# Patient Record
Sex: Female | Born: 1965 | Race: White | Hispanic: No | Marital: Married | Smoking: Former smoker
Health system: Southern US, Community
[De-identification: ages and names within clinical notes are randomized; demographics above are authoritative.]

## PROBLEM LIST (undated history)

## (undated) DIAGNOSIS — N319 Neuromuscular dysfunction of bladder, unspecified: Secondary | ICD-10-CM

## (undated) DIAGNOSIS — I10 Essential (primary) hypertension: Secondary | ICD-10-CM

## (undated) DIAGNOSIS — I471 Supraventricular tachycardia, unspecified: Secondary | ICD-10-CM

## (undated) DIAGNOSIS — G35 Multiple sclerosis: Secondary | ICD-10-CM

## (undated) DIAGNOSIS — F419 Anxiety disorder, unspecified: Secondary | ICD-10-CM

## (undated) HISTORY — DX: Neuromuscular dysfunction of bladder, unspecified: N31.9

## (undated) HISTORY — DX: Anxiety disorder, unspecified: F41.9

## (undated) HISTORY — DX: Multiple sclerosis: G35

## (undated) HISTORY — DX: Essential (primary) hypertension: I10

---

## 1999-05-11 DIAGNOSIS — G35 Multiple sclerosis: Secondary | ICD-10-CM

## 1999-05-11 HISTORY — DX: Multiple sclerosis: G35

## 2004-02-13 ENCOUNTER — Ambulatory Visit: Payer: Self-pay

## 2005-03-15 ENCOUNTER — Ambulatory Visit: Payer: Self-pay

## 2006-04-28 ENCOUNTER — Ambulatory Visit: Payer: Self-pay

## 2008-05-27 ENCOUNTER — Inpatient Hospital Stay: Payer: Self-pay | Admitting: Internal Medicine

## 2008-07-19 ENCOUNTER — Ambulatory Visit: Payer: Self-pay | Admitting: Internal Medicine

## 2010-11-16 LAB — HM PAP SMEAR: HM Pap smear: NORMAL

## 2011-02-11 ENCOUNTER — Ambulatory Visit: Payer: Self-pay | Admitting: Internal Medicine

## 2011-09-23 ENCOUNTER — Ambulatory Visit (INDEPENDENT_AMBULATORY_CARE_PROVIDER_SITE_OTHER): Payer: BC Managed Care – PPO | Admitting: Internal Medicine

## 2011-09-23 ENCOUNTER — Encounter: Payer: Self-pay | Admitting: Internal Medicine

## 2011-09-23 VITALS — BP 152/80 | HR 68 | Temp 98.1°F | Resp 16 | Ht 66.0 in | Wt 234.5 lb

## 2011-09-23 DIAGNOSIS — I10 Essential (primary) hypertension: Secondary | ICD-10-CM | POA: Insufficient documentation

## 2011-09-23 DIAGNOSIS — G35A Relapsing-remitting multiple sclerosis: Secondary | ICD-10-CM | POA: Insufficient documentation

## 2011-09-23 DIAGNOSIS — E669 Obesity, unspecified: Secondary | ICD-10-CM

## 2011-09-23 DIAGNOSIS — G35 Multiple sclerosis: Secondary | ICD-10-CM

## 2011-09-23 DIAGNOSIS — R030 Elevated blood-pressure reading, without diagnosis of hypertension: Secondary | ICD-10-CM

## 2011-09-23 NOTE — Patient Instructions (Signed)
Consider the Low Glycemic Index Diet and 6 smaller meals daily .  This boosts your metabolism and regulates your sugars:   7 AM Low carbohydrate Protein  Shakes (EAS Carb Control  Or Atkins ,  Available everywhere,   In  cases at BJs )  2.5 carbs  (Add or substitute a toasted sandwhich thin w/ peanut butter)  10 AM: Protein bar by Atkins (snack size,  Chocolate lover's variety at  BJ's)    Lunch: sandwich on pita bread or flatbread (Joseph's makes a pita bread and a flat bread , available at [employer] and BJ's; Toufayah makes a low carb flatbread available at [employer] and [employer]) Mission makes a low carb whole wheat tortilla available at BJs,and most grocery stores   3 PM:  Mid day :  Another protein bar,  Or a  cheese stick, 1/4 cup of almonds, walnuts, pistachios, pecans, peanuts,  Macadamia nuts  6 PM  Dinner:  "mean and green:"  Meat/chicken/fish, salad, and green veggie : use ranch, vinagrette,  Blue cheese, etc  9 PM snack : Breyer's low carb fudgsicle or  ice cream bar (Carb Smart), or  Weight Watcher's ice cream bar , or another protein shake  

## 2011-09-23 NOTE — Progress Notes (Signed)
Patient ID: Natalie Pugh, female   DOB: Oct 17, 1965, 46 y.o.   MRN: [id number]  A new patient to establish primary care .  She has a history of supraventricular tachycardia that was diagnosed by Dr. Kirke Corin in 2010 and therefore decided to  transfer to [facility] primary care as well. She has a history of multiple sclerosis and urinary incontinence secondary to this disorder. She sees Dr. Kelly Splinter in 2 neurology and and Dr. Achilles Dunk in from his urology in [place]. Her prior PCP was Hilda Lias. Cornelia Copa at [facility] in [place].  She is up-to-date on cervical cancer screening. Her last Pap smear was 12/17/2010. Her last mammogram was October 2012.   Her chief complaint is overweight. She is interested in losing weight by diet and exercise. She has gained 100 pounds since the birth of her children and would like to adopt a healthier lifestyle with steady weight loss until she can reach BMI of 25.  She has tried Weight Watchers in the past with limited success

## 2011-09-23 NOTE — Assessment & Plan Note (Addendum)
Relapsing remitting managed by doctors on [facility] go for neurologic. For her concurren optic neuritis, she is scheduled to follow up with [facility], but is overdue by one year because of financial difficulties.

## 2011-09-26 ENCOUNTER — Encounter: Payer: Self-pay | Admitting: Internal Medicine

## 2011-09-26 NOTE — Assessment & Plan Note (Signed)
She has a history of white coat syndrome and takes metoprolol for management of her SVT.

## 2011-09-26 NOTE — Assessment & Plan Note (Addendum)
Currently her obesity has been a lifelong struggle. She did lose 110 pounds as a teenager but gained her weight back after she had children. have addressed  BMI and recommended a low glycemic index diet utilizing smaller more frequent meals to increase metabolism.  I have also recommended that patient start exercising with a goal of 30 minutes of aerobic exercise a minimum of 5 days per week. Screening for lipid disorders, thyroid and diabetes was done last August at her last annual exam. These will be repeated prior to her physical in August which is at times during a diet we have discussed. A total of 45 minutes was spent with this patient today in evaluation and management.

## 2011-12-07 ENCOUNTER — Telehealth: Payer: Self-pay | Admitting: *Deleted

## 2011-12-07 ENCOUNTER — Other Ambulatory Visit (INDEPENDENT_AMBULATORY_CARE_PROVIDER_SITE_OTHER): Payer: BC Managed Care – PPO | Admitting: *Deleted

## 2011-12-07 DIAGNOSIS — E669 Obesity, unspecified: Secondary | ICD-10-CM

## 2011-12-07 DIAGNOSIS — Z0000 Encounter for general adult medical examination without abnormal findings: Secondary | ICD-10-CM

## 2011-12-07 DIAGNOSIS — Z79899 Other long term (current) drug therapy: Secondary | ICD-10-CM

## 2011-12-07 LAB — COMPREHENSIVE METABOLIC PANEL
ALT: 62 U/L — ABNORMAL HIGH (ref 0–35)
AST: 44 U/L — ABNORMAL HIGH (ref 0–37)
Alkaline Phosphatase: 46 U/L (ref 39–117)
Sodium: 138 mEq/L (ref 135–145)
Total Bilirubin: 0.4 mg/dL (ref 0.3–1.2)
Total Protein: 7.1 g/dL (ref 6.0–8.3)

## 2011-12-07 LAB — HEMOGLOBIN A1C: Hgb A1c MFr Bld: 6 % (ref 4.6–6.5)

## 2011-12-07 NOTE — Telephone Encounter (Signed)
Orders in epic,  thanks

## 2011-12-07 NOTE — Telephone Encounter (Signed)
Patient came in for lab this morning, what would you like me to order?

## 2011-12-17 ENCOUNTER — Ambulatory Visit (INDEPENDENT_AMBULATORY_CARE_PROVIDER_SITE_OTHER): Payer: BC Managed Care – PPO | Admitting: Internal Medicine

## 2011-12-17 ENCOUNTER — Encounter: Payer: Self-pay | Admitting: Internal Medicine

## 2011-12-17 VITALS — BP 128/94 | HR 84 | Temp 98.4°F | Resp 12 | Ht 65.0 in | Wt 225.5 lb

## 2011-12-17 DIAGNOSIS — G4700 Insomnia, unspecified: Secondary | ICD-10-CM

## 2011-12-17 DIAGNOSIS — E669 Obesity, unspecified: Secondary | ICD-10-CM

## 2011-12-17 DIAGNOSIS — Z1239 Encounter for other screening for malignant neoplasm of breast: Secondary | ICD-10-CM

## 2011-12-17 DIAGNOSIS — R7989 Other specified abnormal findings of blood chemistry: Secondary | ICD-10-CM

## 2011-12-17 DIAGNOSIS — Z124 Encounter for screening for malignant neoplasm of cervix: Secondary | ICD-10-CM

## 2011-12-17 DIAGNOSIS — I10 Essential (primary) hypertension: Secondary | ICD-10-CM

## 2011-12-17 DIAGNOSIS — Z0000 Encounter for general adult medical examination without abnormal findings: Secondary | ICD-10-CM

## 2011-12-17 DIAGNOSIS — R030 Elevated blood-pressure reading, without diagnosis of hypertension: Secondary | ICD-10-CM

## 2011-12-17 MED ORDER — AMLODIPINE BESYLATE 2.5 MG PO TABS: 2.5000 mg | ORAL_TABLET | Freq: Every day | ORAL | Status: DC

## 2011-12-17 NOTE — Patient Instructions (Addendum)
Your liver enzymes were sightly elevated.  We will repeat them  At the end of August with fasting lipids     Consider a Low Glycemic Index Diet and eating 6 smaller meals daily .  This frequent feeding stimulates your metabolism and the lower glycemic index foods will lower your blood sugars:   This is an example of my daily  "Low GI"  Diet:  All of the foods can be found at grocery stores and in bulk at BJs  club   7 AM Breakfast:  Low carbohydrate Protein  Shakes (I recommend the EAS AdvantEdge "Carb Control" shakes  Or the low carb shakes by Atkins.   Both are available everywhere:  In  cases at BJs  Or in 4 packs at grocery stores and pharmacies  2.5 carbs  (Alternative is  a toasted Arnold's Sandwhich Thin w/ peanut butter, a "Bagel Thin" with cream cheese and salmon) or  a scrambled egg burrito made with a low carb tortilla .  Avoid cereal and bananas, oatmeal too unless the old fashioned kind that takes 30-40 minutes to prepare.  the rest is overly processed, has minimal fiber, and loaded with carbohydrates!   10 AM: Protein bar by Atkins (the snack size, under 200 cal.  There are many varieties , available widely again or in bulk in limited varieties at BJs)  Other so called "protein bars" tend to be loaded with carbohydrates.  Remember, in food advertising, the word "energy" is synonymous for " carbohydrate."  Lunch: sandwich of [place], (or any lunchmeat or canned tuna), fresh avocado and cheese on a lower carbohydrate pita bread, flatbread, or tortilla . Ok to use mayonnaise. The bread is the only source or carbohydrate that can be decreased (Joseph's makes a pita bread and a flat bread  Are 50 cal and 4 net carbs ; Toufayan makes a low carb flatbread 100 cal and 9 net carbs  and  Mission makes a low carb whole wheat tortilla  210 cal and 6 net carbs)  3 PM:  Mid day :  Another protein bar,  Or a  cheese stick (100 cal, 0 carbs),  Or 1 ounce of  almonds, walnuts, pistachios, pecans, peanuts,   Macadamia nuts. Or a Dannon light n Fit greek yogurt, 80 cal 8 net carbs . Avoid "granola"; the dried cranberries and raisins are loaded with carbohydrates.    6 PM  Dinner:  "mean and green:"  Meat/chicken/fish or a high protein legume; , with a green salad, and a low GI  Veggie (broccoli, cauliflower, green beans, spinach, brussel sprouts. Lima beans) : Avoid "Low fat dressings, Reyne Dumas and [address]! They are loaded with sugar! Instead use ranch, vinagrette,  Blue cheese, etc  9 PM snack : Breyer's "low carb" fudgsicle or  ice cream bar (Carb Smart line), or  Weight Watcher's ice cream bar , or anouther "no sugar added" ice cream; or another protein shake or a serving of fresh fruit with whipped cream (Avoid bananas, pineapple, grapes  and watermelon on a regular basis because they are high in sugar)   Remember that snack Substitutions should be less than 15 to 20 carbs  Per serving. Remember to subtract fiber grams to get the "net carbs."

## 2011-12-17 NOTE — Progress Notes (Signed)
Patient ID: Natalie Pugh, female   DOB: 15-Jun-1965, 46 y.o.   MRN: [id number]  Subjective:     Natalie Pugh is a 46 y.o. female and is here for a comprehensive physical exam. The patient reports no problems.  History   Social History  . Marital Status: Married    Spouse Name: N/A    Number of Children: N/A  . Years of Education: N/A   Occupational History  . Not on file.   Social History Main Topics  . Smoking status: Former Smoker    Types: Cigarettes    Quit date: 09/22/1993  . Smokeless tobacco: Never Used  . Alcohol Use: Yes  . Drug Use: No  . Sexually Active: Not on file   Other Topics Concern  . Not on file   Social History Narrative  . No narrative on file   Health Maintenance  Topic Date Due  . Tetanus/tdap  01/18/1983  . Influenza Vaccine  02/08/2012  . Pap Smear  11/15/2013    The following portions of the patient's history were reviewed and updated as appropriate: allergies, current medications, past family history, past medical history, past social history, past surgical history and problem list.  Review of Systems A comprehensive review of systems was negative.   Objective:    BP 128/94  Pulse 84  Temp 98.4 F (36.9 C) (Oral)  Resp 12  Ht 5\' 5"  (1.651 m)  Wt 225 lb 8 oz (102.286 kg)  BMI 37.53 kg/m2  SpO2 97%  LMP 12/14/2011 General appearance: alert, cooperative and appears stated age Eyes: conjunctivae/corneas clear. PERRL, EOM's intact. Fundi benign. Ears: normal TM's and external ear canals both ears Neck: no adenopathy, no carotid bruit, no JVD, supple, symmetrical, trachea midline and thyroid not enlarged, symmetric, no tenderness/mass/nodules Lungs: clear to auscultation bilaterally Breasts: normal appearance, no masses or tenderness Heart: regular rate and rhythm, S1, S2 normal, no murmur, click, rub or gallop Abdomen: soft, non-tender; bowel sounds normal; no masses,  no organomegaly Extremities: extremities normal, atraumatic, no  cyanosis or edema Pulses: 2+ and symmetric Skin: Skin color, texture, turgor normal. No rashes or lesions    Assessment:   Obesity (BMI 30-39.9) I have addressed  BMI and given her hemoglobin A1c is 6.0 I have recommended a low glycemic index diet utilizing smaller more frequent meals to increase metabolism.  I have also recommended that patient start exercising with a goal of 30 minutes of aerobic exercise a minimum of 5 days per week. She has lost 10 pounds since her last visit .  Thyroid function is normal. her labs suggest she may have hepatic steatosis vs hepatic inflammation from her MS medications.l  She will return for repeat hepatic panel and fasting lipids in 4 weeks.      Other abnormal blood chemistry hepatic enzymes were slightly elevated, which could be due to fatty liver given her obesity versus inflammation from her MS medications. She will return in 4 weeks for repeat. If still abnormal we will start serologies for autoimmune hepatitis and obtain an abdominal ultrasound.  White coat syndrome without diagnosis of hypertension Her diastolic blood pressures have been elevated both here and at home. Determined that given her urinary frequency the best choice would be of amlodipine will start a very low dose to 2.5 mg   Updated Medication List Outpatient Encounter Prescriptions as of 12/17/2011  Medication Sig Dispense Refill  . ALPRAZolam (XANAX) 1 MG tablet Take 0.5 mg by mouth at bedtime as needed.      Marland Kitchen  Cholecalciferol (VITAMIN D3) 1000 UNITS CAPS Take by mouth.      . interferon beta-1a (REBIF) 44 MCG/0.5ML injection Inject 44 mcg into the skin 3 (three) times a week.      . metoprolol succinate (TOPROL-XL) 50 MG 24 hr tablet Take 50 mg by mouth daily. Take with or immediately following a meal.      . Multiple Vitamin (MULTIVITAMIN) tablet Take 1 tablet by mouth daily.      Marland Kitchen oxybutynin (DITROPAN-XL) 5 MG 24 hr tablet Take 5 mg by mouth daily.      Marland Kitchen amLODipine (NORVASC)  2.5 MG tablet Take 1 tablet (2.5 mg total) by mouth daily.  90 tablet  3

## 2011-12-19 ENCOUNTER — Encounter: Payer: Self-pay | Admitting: Internal Medicine

## 2011-12-19 DIAGNOSIS — R7989 Other specified abnormal findings of blood chemistry: Secondary | ICD-10-CM | POA: Insufficient documentation

## 2011-12-19 DIAGNOSIS — R7303 Prediabetes: Secondary | ICD-10-CM | POA: Insufficient documentation

## 2011-12-19 DIAGNOSIS — F411 Generalized anxiety disorder: Secondary | ICD-10-CM | POA: Insufficient documentation

## 2011-12-19 DIAGNOSIS — I10 Essential (primary) hypertension: Secondary | ICD-10-CM | POA: Insufficient documentation

## 2011-12-19 NOTE — Assessment & Plan Note (Signed)
Her diastolic blood pressures have been elevated both here and at home. Determined that given her urinary frequency the best choice would be of amlodipine will start a very low dose to 2.5 mg

## 2011-12-19 NOTE — Assessment & Plan Note (Signed)
hepatic enzymes were slightly elevated, which could be due to fatty liver given her obesity versus inflammation from her MS medications. She will return in 4 weeks for repeat. If still abnormal we will start serologies for autoimmune hepatitis and obtain an abdominal ultrasound.

## 2011-12-19 NOTE — Assessment & Plan Note (Addendum)
I have addressed  BMI and given her hemoglobin A1c is 6.0 I have recommended a low glycemic index diet utilizing smaller more frequent meals to increase metabolism.  I have also recommended that patient start exercising with a goal of 30 minutes of aerobic exercise a minimum of 5 days per week. She has lost 10 pounds since her last visit .  Thyroid function is normal. her labs suggest she may have hepatic steatosis vs hepatic inflammation from her MS medications.l  She will return for repeat hepatic panel and fasting lipids in 4 weeks.

## 2011-12-20 ENCOUNTER — Other Ambulatory Visit: Payer: Self-pay | Admitting: Internal Medicine

## 2011-12-20 MED ORDER — METOPROLOL SUCCINATE ER 50 MG PO TB24: 50.0000 mg | ORAL_TABLET | Freq: Every day | ORAL | Status: DC

## 2012-01-04 ENCOUNTER — Other Ambulatory Visit: Payer: BC Managed Care – PPO

## 2012-01-05 ENCOUNTER — Other Ambulatory Visit (INDEPENDENT_AMBULATORY_CARE_PROVIDER_SITE_OTHER): Payer: BC Managed Care – PPO | Admitting: *Deleted

## 2012-01-05 DIAGNOSIS — R7989 Other specified abnormal findings of blood chemistry: Secondary | ICD-10-CM

## 2012-01-05 DIAGNOSIS — E669 Obesity, unspecified: Secondary | ICD-10-CM

## 2012-01-05 LAB — COMPREHENSIVE METABOLIC PANEL
ALT: 21 U/L (ref 0–35)
CO2: 25 mEq/L (ref 19–32)
Calcium: 8.8 mg/dL (ref 8.4–10.5)
Chloride: 104 mEq/L (ref 96–112)
GFR: 79.1 mL/min (ref >60.00)
Sodium: 137 mEq/L (ref 135–145)
Total Protein: 6.9 g/dL (ref 6.0–8.3)

## 2012-01-05 LAB — LDL CHOLESTEROL, DIRECT: Direct LDL: 81.8 mg/dL

## 2012-01-13 ENCOUNTER — Other Ambulatory Visit: Payer: BC Managed Care – PPO

## 2012-01-18 ENCOUNTER — Telehealth: Payer: Self-pay | Admitting: Internal Medicine

## 2012-01-18 NOTE — Telephone Encounter (Signed)
Patient notified.  Labs mailed to her home address.

## 2012-01-18 NOTE — Telephone Encounter (Signed)
All labs normal,. Including cholesterol   

## 2012-01-18 NOTE — Telephone Encounter (Signed)
Patient called requesting her lab results. Please advise.

## 2012-06-24 ENCOUNTER — Other Ambulatory Visit: Payer: Self-pay

## 2012-07-14 ENCOUNTER — Ambulatory Visit: Payer: BC Managed Care – PPO | Admitting: Internal Medicine

## 2012-07-19 ENCOUNTER — Encounter: Payer: Self-pay | Admitting: Internal Medicine

## 2012-07-19 ENCOUNTER — Ambulatory Visit (INDEPENDENT_AMBULATORY_CARE_PROVIDER_SITE_OTHER): Payer: BC Managed Care – PPO | Admitting: Internal Medicine

## 2012-07-19 VITALS — BP 128/86 | HR 83 | Temp 97.7°F | Resp 16 | Wt 220.5 lb

## 2012-07-19 DIAGNOSIS — G4700 Insomnia, unspecified: Secondary | ICD-10-CM

## 2012-07-19 DIAGNOSIS — E119 Type 2 diabetes mellitus without complications: Secondary | ICD-10-CM

## 2012-07-19 DIAGNOSIS — Z23 Encounter for immunization: Secondary | ICD-10-CM

## 2012-07-19 DIAGNOSIS — E669 Obesity, unspecified: Secondary | ICD-10-CM

## 2012-07-19 DIAGNOSIS — E1059 Type 1 diabetes mellitus with other circulatory complications: Secondary | ICD-10-CM

## 2012-07-19 DIAGNOSIS — F411 Generalized anxiety disorder: Secondary | ICD-10-CM

## 2012-07-19 DIAGNOSIS — F419 Anxiety disorder, unspecified: Secondary | ICD-10-CM

## 2012-07-19 DIAGNOSIS — Z79899 Other long term (current) drug therapy: Secondary | ICD-10-CM

## 2012-07-19 DIAGNOSIS — R7989 Other specified abnormal findings of blood chemistry: Secondary | ICD-10-CM

## 2012-07-19 LAB — COMPREHENSIVE METABOLIC PANEL
Albumin: 3.7 g/dL (ref 3.5–5.2)
BUN: 15 mg/dL (ref 6–23)
CO2: 25 mEq/L (ref 19–32)
Calcium: 8.9 mg/dL (ref 8.4–10.5)
Chloride: 106 mEq/L (ref 96–112)
GFR: 72.74 mL/min (ref >60.00)
Glucose, Bld: 105 mg/dL — ABNORMAL HIGH (ref 70–99)
Potassium: 4.4 mEq/L (ref 3.5–5.1)

## 2012-07-19 LAB — CBC WITH DIFFERENTIAL/PLATELET
Basophils Relative: 0.3 % (ref 0.0–3.0)
Eosinophils Relative: 1.2 % (ref 0.0–5.0)
Hemoglobin: 12.5 g/dL (ref 12.0–15.0)
Lymphocytes Relative: 25.9 % (ref 12.0–46.0)
Neutro Abs: 3.4 10*3/uL (ref 1.4–7.7)
Neutrophils Relative %: 62.5 % (ref 43.0–77.0)
RBC: 3.96 Mil/uL (ref 3.87–5.11)
WBC: 5.4 10*3/uL (ref 4.5–10.5)

## 2012-07-19 LAB — HM DIABETES FOOT EXAM: HM Diabetic Foot Exam: NORMAL

## 2012-07-19 LAB — MICROALBUMIN / CREATININE URINE RATIO
Creatinine,U: 33.3 mg/dL
Microalb, Ur: 0.1 mg/dL (ref 0.0–1.9)

## 2012-07-19 MED ORDER — METOPROLOL SUCCINATE ER 50 MG PO TB24: 50.0000 mg | ORAL_TABLET | Freq: Every day | ORAL | Status: DC

## 2012-07-19 MED ORDER — ALPRAZOLAM 1 MG PO TABS: 0.5000 mg | ORAL_TABLET | Freq: Three times a day (TID) | ORAL | Status: DC | PRN

## 2012-07-19 MED ORDER — SERTRALINE HCL 100 MG PO TABS: 100.0000 mg | ORAL_TABLET | Freq: Every day | ORAL | Status: DC

## 2012-07-19 NOTE — Patient Instructions (Addendum)
Start with 1/2 tablet zoloft and increase to 2 after 2 weeks if anxiety is jot under control    Alprazolam dose increased byt use only as needed since it is habit forming   Dreamfield's pasta is low carb and tastes great !  Try to walk for 30 minutes daily for exercise

## 2012-07-19 NOTE — Progress Notes (Signed)
Patient ID: Natalie Pugh, female   DOB: 31-Aug-1965, 47 y.o.   MRN: [id number]  Patient Active Problem List  Diagnosis  . Multiple sclerosis, relapsing-remitting  . White coat syndrome without diagnosis of hypertension  . Obesity (BMI 30-39.9)  . Other abnormal blood chemistry  . Type II or unspecified type diabetes mellitus without mention of complication, not stated as uncontrolled  . Hypertension  . Anxiety    Subjective:  CC:   Chief Complaint  Patient presents with  . Follow-up    6 month    HPI:   Natalie Laukaitisis a 47 y.o. female who presents  For 6 month follow up on hypertension, hyperlipidemia and obesity. She was able to achieve some wt loss last Fall down to 209 lbs but regained several lbs over the holidays.  She has resumed exercising and dieting.   Past Medical History  Diagnosis Date  . Multiple sclerosis, relapsing-remitting 2001    managed since 2001,  present since 1994,  Dr. Gardiner Ramus Neurolgoy  . Diabetes mellitus   . Hypertension   . Anxiety     History reviewed. No pertinent past surgical history.   The following portions of the patient's history were reviewed and updated as appropriate: Allergies, current medications, and problem list.   Review of Systems:   Patient denies headache, fevers, malaise, unintentional weight loss, skin rash, eye pain, sinus congestion and sinus pain, sore throat, dysphagia,  hemoptysis , cough, dyspnea, wheezing, chest pain, palpitations, orthopnea, edema, abdominal pain, nausea, melena, diarrhea, constipation, flank pain, dysuria, hematuria, urinary  Frequency, nocturia, numbness, tingling, seizures,  Focal weakness, Loss of consciousness,  Tremor, insomnia, depression, anxiety, and suicidal ideation.      History   Social History  . Marital Status: Married    Spouse Name: N/A    Number of Children: N/A  . Years of Education: N/A   Occupational History  . Not on file.   Social History Main Topics  .  Smoking status: Former Smoker    Types: Cigarettes    Quit date: 09/22/1993  . Smokeless tobacco: Never Used  . Alcohol Use: Yes  . Drug Use: No  . Sexually Active: Not on file   Other Topics Concern  . Not on file   Social History Narrative  . No narrative on file    Objective:  BP 128/86  Pulse 83  Temp(Src) 97.7 F (36.5 C) (Oral)  Resp 16  Wt 220 lb 8 oz (100.018 kg)  BMI 36.69 kg/m2  SpO2 97%  LMP 07/14/2012  General appearance: alert, cooperative and appears stated age Ears: normal TM's and external ear canals both ears Throat: lips, mucosa, and tongue normal; teeth and gums normal Neck: no adenopathy, no carotid bruit, supple, symmetrical, trachea midline and thyroid not enlarged, symmetric, no tenderness/mass/nodules Back: symmetric, no curvature. ROM normal. No CVA tenderness. Lungs: clear to auscultation bilaterally Heart: regular rate and rhythm, S1, S2 normal, no murmur, click, rub or gallop Abdomen: soft, non-tender; bowel sounds normal; no masses,  no organomegaly Pulses: 2+ and symmetric Skin: Skin color, texture, turgor normal. No rashes or lesions Lymph nodes: Cervical, supraclavicular, and axillary nodes normal. Foot exam:  Nails are well trimmed,  No callouses,  Sensation intact to microfilament  Assessment and Plan:  Anxiety Not well controlled on current regimen of zoloft and alprazolam.  Increased both today.   Type II or unspecified type diabetes mellitus without mention of complication, not stated as uncontrolled Well controlled with diet alone,  a1c is 5.7 a dn she has no proteinuria  Obesity (BMI 30-39.9) I have addressed  BMI and recommended a low glycemic index diet utilizing smaller more frequent meals to increase metabolism.  I have also recommended that patient start exercising with a goal of 30 minutes of aerobic exercise a minimum of 5 days per week.   Other abnormal blood chemistry Now resolved , etiology unclear,    Updated  Medication List Outpatient Encounter Prescriptions as of 07/19/2012  Medication Sig Dispense Refill  . ALPRAZolam (XANAX) 1 MG tablet Take 0.5 tablets (0.5 mg total) by mouth 3 (three) times daily as needed for sleep or anxiety.  90 tablet  3  . amLODipine (NORVASC) 2.5 MG tablet Take 1 tablet (2.5 mg total) by mouth daily.  90 tablet  3  . Cholecalciferol (VITAMIN D3) 1000 UNITS CAPS Take by mouth.      . interferon beta-1a (REBIF) 44 MCG/0.5ML injection Inject 44 mcg into the skin 3 (three) times a week.      . metoprolol succinate (TOPROL-XL) 50 MG 24 hr tablet Take 1 tablet (50 mg total) by mouth daily. Take with or immediately following a meal.  90 tablet  3  . Multiple Vitamin (MULTIVITAMIN) tablet Take 1 tablet by mouth daily.      Marland Kitchen oxybutynin (DITROPAN-XL) 5 MG 24 hr tablet Take 5 mg by mouth daily as needed.       . [DISCONTINUED] ALPRAZolam (XANAX) 1 MG tablet Take 0.5 mg by mouth at bedtime as needed.      . [DISCONTINUED] metoprolol succinate (TOPROL-XL) 50 MG 24 hr tablet Take 1 tablet (50 mg total) by mouth daily. Take with or immediately following a meal.  30 tablet  5  . sertraline (ZOLOFT) 100 MG tablet Take 1 tablet (100 mg total) by mouth daily.  90 tablet  3   No facility-administered encounter medications on file as of 07/19/2012.

## 2012-07-20 ENCOUNTER — Encounter: Payer: Self-pay | Admitting: Internal Medicine

## 2012-07-20 NOTE — Assessment & Plan Note (Signed)
Well controlled with diet alone,  a1c is 5.7 a dn she has no proteinuria

## 2012-07-20 NOTE — Assessment & Plan Note (Signed)
I have addressed  BMI and recommended a low glycemic index diet utilizing smaller more frequent meals to increase metabolism.  I have also recommended that patient start exercising with a goal of 30 minutes of aerobic exercise a minimum of 5 days per week.  

## 2012-07-20 NOTE — Assessment & Plan Note (Signed)
Not well controlled on current regimen of zoloft and alprazolam.  Increased both today.

## 2012-07-20 NOTE — Assessment & Plan Note (Signed)
Now resolved , etiology unclear,

## 2012-07-24 ENCOUNTER — Ambulatory Visit: Payer: BC Managed Care – PPO | Admitting: Internal Medicine

## 2012-11-07 ENCOUNTER — Ambulatory Visit: Payer: BC Managed Care – PPO | Admitting: Internal Medicine

## 2012-12-26 ENCOUNTER — Encounter: Payer: Self-pay | Admitting: Internal Medicine

## 2013-01-15 ENCOUNTER — Ambulatory Visit: Payer: Self-pay | Admitting: Nurse Practitioner

## 2013-02-21 ENCOUNTER — Encounter: Payer: Self-pay | Admitting: *Deleted

## 2013-02-21 ENCOUNTER — Encounter: Payer: Self-pay | Admitting: Internal Medicine

## 2013-02-21 ENCOUNTER — Ambulatory Visit (INDEPENDENT_AMBULATORY_CARE_PROVIDER_SITE_OTHER): Payer: BC Managed Care – PPO | Admitting: Internal Medicine

## 2013-02-21 VITALS — BP 146/88 | HR 81 | Temp 98.0°F | Resp 14 | Ht 66.0 in | Wt 232.0 lb

## 2013-02-21 DIAGNOSIS — Z1239 Encounter for other screening for malignant neoplasm of breast: Secondary | ICD-10-CM

## 2013-02-21 DIAGNOSIS — E669 Obesity, unspecified: Secondary | ICD-10-CM

## 2013-02-21 DIAGNOSIS — F411 Generalized anxiety disorder: Secondary | ICD-10-CM

## 2013-02-21 DIAGNOSIS — R030 Elevated blood-pressure reading, without diagnosis of hypertension: Secondary | ICD-10-CM

## 2013-02-21 DIAGNOSIS — Z79899 Other long term (current) drug therapy: Secondary | ICD-10-CM

## 2013-02-21 DIAGNOSIS — G4700 Insomnia, unspecified: Secondary | ICD-10-CM

## 2013-02-21 DIAGNOSIS — Z23 Encounter for immunization: Secondary | ICD-10-CM

## 2013-02-21 DIAGNOSIS — E559 Vitamin D deficiency, unspecified: Secondary | ICD-10-CM

## 2013-02-21 DIAGNOSIS — F419 Anxiety disorder, unspecified: Secondary | ICD-10-CM

## 2013-02-21 DIAGNOSIS — R7989 Other specified abnormal findings of blood chemistry: Secondary | ICD-10-CM

## 2013-02-21 DIAGNOSIS — E785 Hyperlipidemia, unspecified: Secondary | ICD-10-CM

## 2013-02-21 DIAGNOSIS — Z0000 Encounter for general adult medical examination without abnormal findings: Secondary | ICD-10-CM

## 2013-02-21 MED ORDER — ALPRAZOLAM 1 MG PO TABS: 0.5000 mg | ORAL_TABLET | Freq: Three times a day (TID) | ORAL | Status: DC | PRN

## 2013-02-21 MED ORDER — ESCITALOPRAM OXALATE 20 MG PO TABS: 20.0000 mg | ORAL_TABLET | Freq: Every day | ORAL | Status: DC

## 2013-02-21 MED ORDER — AMLODIPINE BESYLATE 2.5 MG PO TABS: 2.5000 mg | ORAL_TABLET | Freq: Every day | ORAL | Status: DC

## 2013-02-21 MED ORDER — LACTULOSE 20 GM/30ML PO SOLN: 30.0000 mL | ORAL | Status: DC | PRN

## 2013-02-21 NOTE — Patient Instructions (Signed)
We discussed changing generic zoloft to generic lexapro You do not need to cross taper.  Simply stop the zoloft and take the lexapro  Please check your bp once a week.,  Good control is < 130/80  mammogam will be scheduled ASAP

## 2013-02-21 NOTE — Progress Notes (Signed)
Patient ID: Natalie Pugh, female   DOB: July 05, 1965, 47 y.o.   MRN: [id number]   Subjective:     Natalie Pugh is a 47 y.o. female and is here for a comprehensive physical exam. The patient reports .Marland Kitchen  History   Social History  . Marital Status: Married    Spouse Name: N/A    Number of Children: N/A  . Years of Education: N/A   Occupational History  . Not on file.   Social History Main Topics  . Smoking status: Former Smoker    Types: Cigarettes    Quit date: 09/22/1993  . Smokeless tobacco: Never Used  . Alcohol Use: Yes  . Drug Use: No  . Sexual Activity: Yes   Other Topics Concern  . Not on file   Social History Narrative  . No narrative on file   Health Maintenance  Topic Date Due  . Influenza Vaccine  12/08/2013  . Pap Smear  12/17/2014  . Tetanus/tdap  07/20/2022    The following portions of the patient's history were reviewed and updated as appropriate: allergies, current medications, past family history, past medical history, past social history, past surgical history and problem list.  Review of Systems A comprehensive review of systems was negative.   Objective:   BP 146/88  Pulse 81  Temp(Src) 98 F (36.7 C) (Oral)  Resp 14  Ht 5\' 6"  (1.676 m)  Wt 232 lb (105.235 kg)  BMI 37.46 kg/m2  SpO2 98%  LMP 12/24/2012  General Appearance:    Alert, cooperative, no distress, appears stated age  Head:    Normocephalic, without obvious abnormality, atraumatic  Eyes:    PERRL, conjunctiva/corneas clear, EOM's intact, fundi    benign, both eyes  Ears:    Normal TM's and external ear canals, both ears  Nose:   Nares normal, septum midline, mucosa normal, no drainage    or sinus tenderness  Throat:   Lips, mucosa, and tongue normal; teeth and gums normal  Neck:   Supple, symmetrical, trachea midline, no adenopathy;    thyroid:  no enlargement/tenderness/nodules; no carotid   bruit or JVD  Back:     Symmetric, no curvature, ROM normal, no CVA  tenderness  Lungs:     Clear to auscultation bilaterally, respirations unlabored  Chest Wall:    No tenderness or deformity   Heart:    Regular rate and rhythm, S1 and S2 normal, no murmur, rub   or gallop  Breast Exam:    No tenderness, masses, or nipple abnormality  Abdomen:     Soft, non-tender, bowel sounds active all four quadrants,    no masses, no organomegaly     Extremities:   Extremities normal, atraumatic, no cyanosis or edema  Pulses:   2+ and symmetric all extremities  Skin:   Skin color, texture, turgor normal, no rashes or lesions  Lymph nodes:   Cervical, supraclavicular, and axillary nodes normal  Neurologic:   CNII-XII intact, normal strength, sensation and reflexes    throughout    Assessment and Plan  Routine general medical examination at a health care facility Annual comprehensive exam was done including breast exam.   All screenings have been addressed .   White coat syndrome without diagnosis of hypertension Elevated today.  Reviewed list of meds, patient is not taking OTC meds that could be causing,. It.  Have asked patient to recheck bp at home a minimum of 5 times over the next 4 weeks and call readings to  office for adjustment of medications.    Anxiety To discuss making a transition from Zoloft to Lexapro and she feels that her symptoms are no longer well-controlled. No cross taper required. We'll start Lexapro 20 mg.  Other abnormal blood chemistry Liver enzymes remain normal in repeat testing. No further workup required.  Obesity (BMI 30-39.9) I have addressed  BMI and recommended a low glycemic index diet utilizing smaller more frequent meals to increase metabolism.  I have also recommended that patient start exercising with a goal of 30 minutes of aerobic exercise a minimum of 5 days per week. Screening for lipid disorders, thyroid and diabetes was negative today.   Updated Medication List Outpatient Encounter Prescriptions as of 02/21/2013   Medication Sig Dispense Refill  . ALPRAZolam (XANAX) 1 MG tablet Take 0.5 tablets (0.5 mg total) by mouth 3 (three) times daily as needed for sleep or anxiety.  90 tablet  3  . amLODipine (NORVASC) 2.5 MG tablet Take 1 tablet (2.5 mg total) by mouth daily.  30 tablet  1  . Cholecalciferol (VITAMIN D3) 1000 UNITS CAPS Take by mouth.      . interferon beta-1a (REBIF) 44 MCG/0.5ML injection Inject 44 mcg into the skin 3 (three) times a week.      . metoprolol succinate (TOPROL-XL) 50 MG 24 hr tablet Take 1 tablet (50 mg total) by mouth daily. Take with or immediately following a meal.  90 tablet  3  . Multiple Vitamin (MULTIVITAMIN) tablet Take 1 tablet by mouth daily.      Marland Kitchen oxybutynin (DITROPAN-XL) 5 MG 24 hr tablet Take 5 mg by mouth daily as needed.       . [DISCONTINUED] ALPRAZolam (XANAX) 1 MG tablet Take 0.5 tablets (0.5 mg total) by mouth 3 (three) times daily as needed for sleep or anxiety.  90 tablet  3  . [DISCONTINUED] amLODipine (NORVASC) 2.5 MG tablet Take 1 tablet (2.5 mg total) by mouth daily.  90 tablet  3  . [DISCONTINUED] amLODipine (NORVASC) 2.5 MG tablet Take 2.5 mg by mouth daily.      . [DISCONTINUED] sertraline (ZOLOFT) 100 MG tablet Take 1 tablet (100 mg total) by mouth daily.  90 tablet  3  . escitalopram (LEXAPRO) 20 MG tablet Take 1 tablet (20 mg total) by mouth daily.  30 tablet  3  . Lactulose 20 GM/30ML SOLN Take 30 mLs (20 g total) by mouth as needed (constipatio n).  240 mL  2  . [DISCONTINUED] escitalopram (LEXAPRO) 20 MG tablet Take 1 tablet (20 mg total) by mouth daily.  30 tablet  3   No facility-administered encounter medications on file as of 02/21/2013.

## 2013-02-22 ENCOUNTER — Encounter: Payer: Self-pay | Admitting: *Deleted

## 2013-02-22 LAB — COMPREHENSIVE METABOLIC PANEL
AST: 29 U/L (ref 0–37)
Albumin: 4.1 g/dL (ref 3.5–5.2)
BUN: 17 mg/dL (ref 6–23)
Calcium: 9.5 mg/dL (ref 8.4–10.5)
Chloride: 97 mEq/L (ref 96–112)
Glucose, Bld: 84 mg/dL (ref 70–99)
Potassium: 4 mEq/L (ref 3.5–5.1)
Sodium: 134 mEq/L — ABNORMAL LOW (ref 135–145)
Total Protein: 7.7 g/dL (ref 6.0–8.3)

## 2013-02-22 LAB — LDL CHOLESTEROL, DIRECT: Direct LDL: 105.8 mg/dL

## 2013-02-22 LAB — VITAMIN D 25 HYDROXY (VIT D DEFICIENCY, FRACTURES): Vit D, 25-Hydroxy: 38 ng/mL (ref 30–89)

## 2013-02-23 ENCOUNTER — Encounter: Payer: Self-pay | Admitting: Internal Medicine

## 2013-02-23 MED ORDER — ESCITALOPRAM OXALATE 20 MG PO TABS: 20.0000 mg | ORAL_TABLET | Freq: Every day | ORAL | Status: DC

## 2013-02-23 NOTE — Assessment & Plan Note (Signed)
Liver enzymes remain normal in repeat testing. No further workup required.

## 2013-02-23 NOTE — Assessment & Plan Note (Signed)
Annual comprehensive exam was done including breast exam .  All screenings have been addressed .  

## 2013-02-23 NOTE — Assessment & Plan Note (Signed)
Elevated today.  Reviewed list of meds, patient is not taking OTC meds that could be causing,. It.  Have asked patient to recheck bp at home a minimum of 5 times over the next 4 weeks and call readings to office for adjustment of medications.   

## 2013-02-23 NOTE — Assessment & Plan Note (Signed)
I have addressed  BMI and recommended a low glycemic index diet utilizing smaller more frequent meals to increase metabolism.  I have also recommended that patient start exercising with a goal of 30 minutes of aerobic exercise a minimum of 5 days per week. Screening for lipid disorders, thyroid and diabetes was negative today.

## 2013-02-23 NOTE — Assessment & Plan Note (Signed)
To discuss making a transition from Zoloft to Lexapro and she feels that her symptoms are no longer well-controlled. No cross taper required. We'll start Lexapro 20 mg.

## 2013-03-12 ENCOUNTER — Encounter: Payer: Self-pay | Admitting: Internal Medicine

## 2013-04-09 ENCOUNTER — Other Ambulatory Visit: Payer: Self-pay | Admitting: Neurology

## 2013-07-04 ENCOUNTER — Other Ambulatory Visit: Payer: Self-pay | Admitting: Neurology

## 2013-07-16 ENCOUNTER — Other Ambulatory Visit: Payer: Self-pay | Admitting: *Deleted

## 2013-07-16 DIAGNOSIS — F419 Anxiety disorder, unspecified: Secondary | ICD-10-CM

## 2013-07-16 MED ORDER — ESCITALOPRAM OXALATE 20 MG PO TABS: 20.0000 mg | ORAL_TABLET | Freq: Every day | ORAL | Status: DC

## 2013-07-27 LAB — HM DIABETES EYE EXAM

## 2013-10-10 ENCOUNTER — Ambulatory Visit: Payer: Self-pay | Admitting: Neurology

## 2013-10-16 ENCOUNTER — Ambulatory Visit: Payer: BC Managed Care – PPO | Admitting: Internal Medicine

## 2013-10-25 ENCOUNTER — Encounter: Payer: Self-pay | Admitting: Internal Medicine

## 2013-10-25 ENCOUNTER — Ambulatory Visit (INDEPENDENT_AMBULATORY_CARE_PROVIDER_SITE_OTHER): Payer: BC Managed Care – PPO | Admitting: Internal Medicine

## 2013-10-25 VITALS — BP 120/84 | HR 85 | Temp 97.8°F | Resp 16 | Ht 66.5 in | Wt 216.2 lb

## 2013-10-25 DIAGNOSIS — F411 Generalized anxiety disorder: Secondary | ICD-10-CM

## 2013-10-25 DIAGNOSIS — I10 Essential (primary) hypertension: Secondary | ICD-10-CM

## 2013-10-25 DIAGNOSIS — E119 Type 2 diabetes mellitus without complications: Secondary | ICD-10-CM

## 2013-10-25 DIAGNOSIS — R300 Dysuria: Secondary | ICD-10-CM

## 2013-10-25 DIAGNOSIS — G4700 Insomnia, unspecified: Secondary | ICD-10-CM

## 2013-10-25 DIAGNOSIS — F419 Anxiety disorder, unspecified: Secondary | ICD-10-CM

## 2013-10-25 DIAGNOSIS — E669 Obesity, unspecified: Secondary | ICD-10-CM

## 2013-10-25 DIAGNOSIS — N319 Neuromuscular dysfunction of bladder, unspecified: Secondary | ICD-10-CM

## 2013-10-25 LAB — URINALYSIS, ROUTINE W REFLEX MICROSCOPIC
Bilirubin Urine: NEGATIVE
Hgb urine dipstick: NEGATIVE
Ketones, ur: NEGATIVE
NITRITE: NEGATIVE
RBC / HPF: NONE SEEN (ref 0–?)
Specific Gravity, Urine: <=1.005 — AB (ref 1.000–1.030)
Total Protein, Urine: NEGATIVE
UROBILINOGEN UA: 0.2 (ref 0.0–1.0)
Urine Glucose: NEGATIVE
pH: 7 (ref 5.0–8.0)

## 2013-10-25 LAB — LIPID PANEL
CHOL/HDL RATIO: 3
Cholesterol: 157 mg/dL (ref 0–200)
HDL: 53 mg/dL (ref >39.00)
LDL CALC: 82 mg/dL (ref 0–99)
NonHDL: 104
Triglycerides: 111 mg/dL (ref 0.0–149.0)
VLDL: 22.2 mg/dL (ref 0.0–40.0)

## 2013-10-25 LAB — COMPREHENSIVE METABOLIC PANEL
ALBUMIN: 4.1 g/dL (ref 3.5–5.2)
ALT: 17 U/L (ref 0–35)
AST: 21 U/L (ref 0–37)
Alkaline Phosphatase: 48 U/L (ref 39–117)
BUN: 14 mg/dL (ref 6–23)
CHLORIDE: 101 meq/L (ref 96–112)
CO2: 25 meq/L (ref 19–32)
Calcium: 9.2 mg/dL (ref 8.4–10.5)
Creatinine, Ser: 0.9 mg/dL (ref 0.4–1.2)
GFR: 73.32 mL/min (ref >60.00)
GLUCOSE: 86 mg/dL (ref 70–99)
POTASSIUM: 4.4 meq/L (ref 3.5–5.1)
SODIUM: 135 meq/L (ref 135–145)
TOTAL PROTEIN: 7.5 g/dL (ref 6.0–8.3)
Total Bilirubin: 0.4 mg/dL (ref 0.2–1.2)

## 2013-10-25 LAB — MICROALBUMIN / CREATININE URINE RATIO
CREATININE, U: 54.4 mg/dL
Microalb Creat Ratio: 0.6 mg/g (ref 0.0–30.0)
Microalb, Ur: 0.3 mg/dL (ref 0.0–1.9)

## 2013-10-25 LAB — POCT URINALYSIS DIPSTICK
Bilirubin, UA: NEGATIVE
GLUCOSE UA: NEGATIVE
Ketones, UA: NEGATIVE
NITRITE UA: NEGATIVE
Protein, UA: NEGATIVE
RBC UA: NEGATIVE
SPEC GRAV UA: 1.01
Urobilinogen, UA: 0.2
pH, UA: 7

## 2013-10-25 LAB — HEMOGLOBIN A1C: HEMOGLOBIN A1C: 5.9 % (ref 4.6–6.5)

## 2013-10-25 LAB — HM DIABETES FOOT EXAM: HM Diabetic Foot Exam: NORMAL

## 2013-10-25 MED ORDER — ALPRAZOLAM 1 MG PO TABS: 1.0000 mg | ORAL_TABLET | Freq: Two times a day (BID) | ORAL | Status: DC | PRN

## 2013-10-25 NOTE — Progress Notes (Signed)
Patient ID: Natalie Dutyrika Leggitt, female   DOB: [birth year], 48 y.o.   MRN: [id number]   Patient Active Problem List   Diagnosis Date Noted  . Colonization status 10/27/2013  . Routine general medical examination at a health care facility 02/21/2013  . Other abnormal blood chemistry 12/19/2011  . Type II or unspecified type diabetes mellitus without mention of complication, not stated as uncontrolled   . Hypertension   . Anxiety   . White coat syndrome without diagnosis of hypertension 09/23/2011  . Obesity (BMI 30-39.9) 09/23/2011  . Multiple sclerosis, relapsing-remitting     Subjective:  CC:   Chief Complaint  Patient presents with  . Follow-up    medication refills  . Dysuria    Patient self cath's due to MS.     HPI:   Natalie Pugh is a 48 y.o. female who presents for follow up on chronic conditions including MS, GAD, obesity, hypertension and type 2 DM , diet controlled.    She is requesting a urinalysis because she has noticed a change in odor of urine.  She has been self catheterizing has noticed more fishy odor lately.  Not taking macrobid for suppression ,  Has not had a UTI in  A long time   Obesity .  She weighed 244 lbs in January 2014,  Has lost 28 lbs since then  By exercising regularly at a gym and weight watchers . Next goal is 199.  Using a fit bit to help her stay on track. Feels much better .   Past Medical History  Diagnosis Date  . Multiple sclerosis, relapsing-remitting 2001    managed since 2001,  present since 1994,  Dr. Gardiner [facility]  . Diabetes mellitus   . Hypertension   . Anxiety     No past surgical history on file.     The following portions of the patient's history were reviewed and updated as appropriate: Allergies, current medications, and problem list.    Review of Systems:   Patient denies headache, fevers, malaise, unintentional weight loss, skin rash, eye pain, sinus congestion and sinus pain, sore throat, dysphagia,   hemoptysis , cough, dyspnea, wheezing, chest pain, palpitations, orthopnea, edema, abdominal pain, nausea, melena, diarrhea, constipation, flank pain, dysuria, hematuria, urinary  Frequency, nocturia, numbness, tingling, seizures,  Focal weakness, Loss of consciousness,  Tremor, insomnia, depression, anxiety, and suicidal ideation.     History   Social History  . Marital Status: Married    Spouse Name: N/A    Number of Children: N/A  . Years of Education: N/A   Occupational History  . Not on file.   Social History Main Topics  . Smoking status: Former Smoker    Types: Cigarettes    Quit date: 09/22/1993  . Smokeless tobacco: Never Used  . Alcohol Use: Yes  . Drug Use: No  . Sexual Activity: Yes   Other Topics Concern  . Not on file   Social History Narrative  . No narrative on file    Objective:  Filed Vitals:   10/25/13 0911  BP: 120/84  Pulse: 85  Temp: 97.8 F (36.6 C)  Resp: 16     General appearance: alert, cooperative and appears stated age Ears: normal TM's and external ear canals both ears Throat: lips, mucosa, and tongue normal; teeth and gums normal Neck: no adenopathy, no carotid bruit, supple, symmetrical, trachea midline and thyroid not enlarged, symmetric, no tenderness/mass/nodules Back: symmetric, no curvature. ROM normal. No CVA tenderness. Lungs: clear  to auscultation bilaterally Heart: regular rate and rhythm, S1, S2 normal, no murmur, click, rub or gallop Abdomen: soft, non-tender; bowel sounds normal; no masses,  no organomegaly Pulses: 2+ and symmetric Skin: Skin color, texture, turgor normal. No rashes or lesions Lymph nodes: Cervical, supraclavicular, and axillary nodes normal. Neuro: CNs 2-12 intact. DTRs 2+/4 in biceps, brachioradialis, patellars and achilles. Muscle strength 5/5 in upper and lower exremities. Fine resting tremor bilaterally both hands cerebellar function normal. Romberg negative.  No pronator drift.   Gait normal.    Assessment and Plan:  Anxiety improved with lexapro and prn alprazolam   Obesity (BMI 30-39.9) I have congratulated her in reduction of   BMI and encouraged  Continued weight loss with goal of 10% of body weigh over the next 6 months using a low glycemic index diet and regular exercise a minimum of 5 days per week.    Hypertension Well controlled on current regimen. Renal function stable, no changes today.  Lab Results  Component Value Date   CREATININE 0.9 10/25/2013   Lab Results  Component Value Date   NA 135 10/25/2013   K 4.4 10/25/2013   CL 101 10/25/2013   CO2 25 10/25/2013      Type II or unspecified type diabetes mellitus without mention of complication, not stated as uncontrolled Well-controlled on diet alone .  hemoglobin A1c has been consistently less than 7.0 . Patient is up-to-date on eye exams and foot exam was done today.  There is  no proteinuria on today's micro urinalysis .  Fasting lipids have been reviewed and statin therapy has been advised and updated according to new ACC guidelines based on patient's 10 year risk of CAD   Lab Results  Component Value Date   HGBA1C 5.9 10/25/2013   Lab Results  Component Value Date   MICROALBUR 0.3 10/25/2013      Updated Medication List Outpatient Encounter Prescriptions as of 10/25/2013  Medication Sig  . ALPRAZolam (XANAX) 1 MG tablet Take 1 tablet (1 mg total) by mouth 2 (two) times daily as needed for anxiety or sleep.  . Cholecalciferol (VITAMIN D3) 1000 UNITS CAPS Take by mouth.  . escitalopram (LEXAPRO) 20 MG tablet Take 1 tablet (20 mg total) by mouth daily.  . metoprolol succinate (TOPROL-XL) 50 MG 24 hr tablet Take 1 tablet (50 mg total) by mouth daily. Take with or immediately following a meal.  . Multiple Vitamin (MULTIVITAMIN) tablet Take 1 tablet by mouth daily.  Marland Kitchen oxybutynin (DITROPAN-XL) 5 MG 24 hr tablet Take 5 mg by mouth daily as needed.   . REBIF 44 MCG/0.5ML injection INJECT 1 PREFILLED SYRINGE  SUBCUTANEOUSLY 3 TIMES PER WEEK AS DIRECTED  . [DISCONTINUED] ALPRAZolam (XANAX) 1 MG tablet Take 0.5 tablets (0.5 mg total) by mouth 3 (three) times daily as needed for sleep or anxiety.  . [DISCONTINUED] amLODipine (NORVASC) 2.5 MG tablet Take 1 tablet (2.5 mg total) by mouth daily.  . Lactulose 20 GM/30ML SOLN Take 30 mLs (20 g total) by mouth as needed (constipatio n).

## 2013-10-25 NOTE — Patient Instructions (Signed)
You are due for your eye exam with Dr Oren BracketSydnor.  Get him to send me a report   Congratulations on the wt loss!!!  This is  One version of a  "Low GI"  Diet:  It will still lower your blood sugars and allow you to lose 4 to 8  lbs  per month if you follow it carefully.  Your goal with exercise is a minimum of 30 minutes of aerobic exercise 5 days per week (Walking does not count once it becomes easy!)    All of the foods can be found at grocery stores and in bulk at [employer].  The Atkins protein bars and shakes are available in more varieties at Target, WalMart and [employer].     7 AM Breakfast:  Choose from the following:  Low carbohydrate Protein  Shakes (I recommend the [employer]vantEdge "Carb Control" shakes  Or the low carb shakes by Atkins.    2.5 carbs   Arnold's "Sandwhich Thin"toasted  w/ peanut butter (no jelly: about 20 net carbs  "Bagel Thin" with cream cheese and salmon: about 20 carbs   a scrambled egg/bacon/cheese burrito made with Mission's "carb balance" whole wheat tortilla  (about 10 net carbs )   Avoid cereal and bananas, oatmeal and cream of wheat and grits. They are loaded with carbohydrates!   10 AM: high protein snack  Protein bar by Atkins (the snack size, under 200 cal, usually < 6 net carbs).    A stick of cheese:  Around 1 carb,  100 cal     Dannon Light n Fit [place] Yogurt  (80 cal, 8 carbs)  Other so called "protein bars" and Greek yogurts tend to be loaded with carbohydrates.  Remember, in food advertising, the word "energy" is synonymous for " carbohydrate."  Lunch:   A Sandwich using the bread choices listed, Can use any  Eggs,  lunchmeat, grilled meat or canned tuna), avocado, regular mayo/mustard  and cheese.  A Salad using blue cheese, ranch,  Goddess or vinagrette,  No croutons or "confetti" and no "candied nuts" but regular nuts OK.   No pretzels or chips.  Pickles and miniature sweet peppers are a good low carb alternative that provide a "crunch"  The  bread is the only source of carbohydrate in a sandwich and  can be decreased by trying some of these alternatives to traditional loaf bread  Joseph's makes a pita bread and a flat bread that are 50 cal and 4 net carbs available at [employer]Js and WalMart.  This can be toasted to use with hummous as well  Toufayan makes a low carb flatbread that's 100 cal and 9 net carbs available at [employer] and [employer]  Mission makes 2 sizes of  Low carb whole wheat tortilla  (The large one is 210 cal and 6 net carbs) Avoid "Low fat dressings, as well as Reyne Dum[place] and [address] dressings They are loaded with sugar!   3 PM/ Mid day  Snack:  Consider  1 ounce of  almonds, walnuts, pistachios, pecans, peanuts,  Macadamia nuts or a nut medley.  Avoid "granola"; the dried cranberries and raisins are loaded with carbohydrates. Mixed nuts as long as there are no raisins,  cranberries or dried fruit.    Try the prosciutto/mozzarella cheese sticks by Fiorruci  In deli /backery section   High protein      6 PM  Dinner:     Meat/fowl/fish with a green salad, and either  broccoli, cauliflower, green beans, spinach, brussel sprouts or  Lima beans. DO NOT BREAD THE PROTEIN!!      There is a low carb pasta by Dreamfield's that is acceptable and tastes great: only 5 digestible carbs/serving.( All grocery stores but BJs carry it )  Try Kai Levins Angelo's chicken piccata or chicken or eggplant parm over low carb pasta.(Lowes and BJs)   Clifton Custard Sanchez's "Carnitas" (pulled pork, no sauce,  0 carbs) or his beef pot roast to make a dinner burrito (at [employer]J's)  Pesto over low carb pasta (bj's sells a good quality pesto in the center refrigerated section of the deli   Try sauteeing  Roosvelt Harps with mushroooms  Whole wheat pasta is still full of digestible carbs and  Not as low in glycemic index as Dreamfield's.   Brown rice is still rice,  So skip the rice and noodles if you eat [place] or [place] (or at least limit to 1/2 cup)  9 PM snack :    Breyer's "low carb" fudgsicle or  ice cream bar (Carb Smart line), or  Weight Watcher's ice cream bar , or another "no sugar added" ice cream;  a serving of fresh berries/cherries with whipped cream   Cheese or DANNON'S LlGHT N FIT GREEK YOGURT  8 ounces of [place] unsweetened almond/cococunut milk    Avoid bananas, pineapple, grapes  and watermelon on a regular basis because they are high in sugar.  THINK OF THEM AS DESSERT  Remember that snack Substitutions should be less than 10 NET carbs per serving and meals < 20 carbs. Remember to subtract fiber grams to get the "net carbs."

## 2013-10-25 NOTE — Progress Notes (Signed)
Pre-visit discussion using our clinic review tool. No additional management support is needed unless otherwise documented below in the visit note.  

## 2013-10-25 NOTE — Assessment & Plan Note (Signed)
improved with lexapro and prn alprazolam

## 2013-10-27 ENCOUNTER — Encounter: Payer: Self-pay | Admitting: Internal Medicine

## 2013-10-27 DIAGNOSIS — A499 Bacterial infection, unspecified: Secondary | ICD-10-CM | POA: Insufficient documentation

## 2013-10-27 DIAGNOSIS — N390 Urinary tract infection, site not specified: Secondary | ICD-10-CM

## 2013-10-27 NOTE — Assessment & Plan Note (Signed)
Well-controlled on diet alone .  hemoglobin A1c has been consistently less than 7.0 . Patient is up-to-date on eye exams and foot exam was done today.  There is  no proteinuria on today's micro urinalysis .  Fasting lipids have been reviewed and statin therapy has been advised and updated according to new ACC guidelines based on patient's 10 year risk of CAD   Lab Results  Component Value Date   HGBA1C 5.9 10/25/2013   Lab Results  Component Value Date   MICROALBUR 0.3 10/25/2013

## 2013-10-27 NOTE — Assessment & Plan Note (Signed)
I have congratulated her in reduction of   BMI and encouraged  Continued weight loss with goal of 10% of body weigh over the next 6 months using a low glycemic index diet and regular exercise a minimum of 5 days per week.    

## 2013-10-27 NOTE — Assessment & Plan Note (Signed)
Well controlled on current regimen. Renal function stable, no changes today.  Lab Results  Component Value Date   CREATININE 0.9 10/25/2013   Lab Results  Component Value Date   NA 135 10/25/2013   K 4.4 10/25/2013   CL 101 10/25/2013   CO2 25 10/25/2013

## 2013-10-28 LAB — CULTURE, URINE COMPREHENSIVE

## 2013-11-07 ENCOUNTER — Other Ambulatory Visit: Payer: Self-pay | Admitting: Internal Medicine

## 2013-11-07 ENCOUNTER — Telehealth: Payer: Self-pay | Admitting: *Deleted

## 2013-11-07 DIAGNOSIS — A499 Bacterial infection, unspecified: Secondary | ICD-10-CM

## 2013-11-07 DIAGNOSIS — N390 Urinary tract infection, site not specified: Principal | ICD-10-CM

## 2013-11-07 MED ORDER — CIPROFLOXACIN HCL 500 MG PO TABS: 250.0000 mg | ORAL_TABLET | Freq: Two times a day (BID) | ORAL | Status: DC

## 2013-11-07 NOTE — Telephone Encounter (Signed)
Will treat with cipro  For E Coli UTI .  rx sent

## 2013-11-07 NOTE — Telephone Encounter (Signed)
Spoke with pt advised Rx sent 

## 2013-11-07 NOTE — Telephone Encounter (Signed)
Pt called states she is having continued UTI symptoms.  She is requesting an Rx.  Please advise

## 2013-11-30 ENCOUNTER — Other Ambulatory Visit: Payer: Self-pay | Admitting: Internal Medicine

## 2013-12-03 ENCOUNTER — Telehealth: Payer: Self-pay | Admitting: *Deleted

## 2013-12-03 DIAGNOSIS — F419 Anxiety disorder, unspecified: Secondary | ICD-10-CM

## 2013-12-03 MED ORDER — ESCITALOPRAM OXALATE 20 MG PO TABS: 20.0000 mg | ORAL_TABLET | Freq: Every day | ORAL | Status: DC

## 2013-12-03 NOTE — Telephone Encounter (Signed)
Fax from pharmacy requesting Escitalopram 20mg  tablet.  Last refill 3.9.15, last OV 6.18.15, next OV 12.17.15.  Please advise refill

## 2013-12-03 NOTE — Telephone Encounter (Signed)
90 day supply authorized and sent   

## 2014-03-11 ENCOUNTER — Telehealth: Payer: Self-pay | Admitting: Internal Medicine

## 2014-03-11 NOTE — Telephone Encounter (Signed)
Ms. Natalie Pugh called saying she noticed in her My Chart there's information saying she has Type 2 Diabetes. She said she also noticed this information listed elsewhere. Per [name], she's never been told she has Type 2 Diabetes. She said she has MS and knew she was always borderline for Diabetes but was never actually told she had it. She's very concerned about this and would like someone to call her back. Pt ph# [phone] Thank you.

## 2014-03-11 NOTE — Telephone Encounter (Signed)
Spoke with pt, advised of MDs message.  She verbalized understanding. 

## 2014-03-11 NOTE — Telephone Encounter (Signed)
Technically she has diet controlled DM since 2013 when her A1c was 6.0. , and if she didn't follow a low glycemic index diet she would probably have higher A1c's now.   But i have reviewed the last two years of her labs and she has never had a fasting sugar above 110 and her A1cs have been < 6.0 since then , so I am going to change the diagnosis to impaired fasting glucose.

## 2014-04-25 ENCOUNTER — Ambulatory Visit: Payer: BC Managed Care – PPO | Admitting: Internal Medicine

## 2014-05-10 HISTORY — PX: LUNG REMOVAL, PARTIAL: SHX233

## 2014-06-03 ENCOUNTER — Other Ambulatory Visit: Payer: Self-pay | Admitting: *Deleted

## 2014-06-03 DIAGNOSIS — F419 Anxiety disorder, unspecified: Secondary | ICD-10-CM

## 2014-06-03 MED ORDER — ESCITALOPRAM OXALATE 20 MG PO TABS: 20.0000 mg | ORAL_TABLET | Freq: Every day | ORAL | Status: DC

## 2014-11-21 ENCOUNTER — Other Ambulatory Visit (HOSPITAL_COMMUNITY)
Admission: RE | Admit: 2014-11-21 | Discharge: 2014-11-21 | Disposition: A | Discharge status: Home / Self Care | Payer: BLUE CROSS/BLUE SHIELD | Source: Ambulatory Visit | Attending: Internal Medicine | Admitting: Internal Medicine

## 2014-11-21 ENCOUNTER — Ambulatory Visit (INDEPENDENT_AMBULATORY_CARE_PROVIDER_SITE_OTHER): Payer: BLUE CROSS/BLUE SHIELD | Admitting: Internal Medicine

## 2014-11-21 ENCOUNTER — Encounter: Payer: Self-pay | Admitting: Internal Medicine

## 2014-11-21 VITALS — BP 138/90 | HR 86 | Temp 97.6°F | Resp 14 | Ht 65.75 in | Wt 233.8 lb

## 2014-11-21 DIAGNOSIS — Z01419 Encounter for gynecological examination (general) (routine) without abnormal findings: Secondary | ICD-10-CM | POA: Diagnosis present

## 2014-11-21 DIAGNOSIS — Z1151 Encounter for screening for human papillomavirus (HPV): Secondary | ICD-10-CM | POA: Insufficient documentation

## 2014-11-21 DIAGNOSIS — Z1239 Encounter for other screening for malignant neoplasm of breast: Secondary | ICD-10-CM

## 2014-11-21 DIAGNOSIS — E559 Vitamin D deficiency, unspecified: Secondary | ICD-10-CM | POA: Diagnosis not present

## 2014-11-21 DIAGNOSIS — Z0000 Encounter for general adult medical examination without abnormal findings: Secondary | ICD-10-CM | POA: Diagnosis not present

## 2014-11-21 DIAGNOSIS — E669 Obesity, unspecified: Secondary | ICD-10-CM

## 2014-11-21 DIAGNOSIS — Z124 Encounter for screening for malignant neoplasm of cervix: Secondary | ICD-10-CM | POA: Diagnosis not present

## 2014-11-21 DIAGNOSIS — R7301 Impaired fasting glucose: Secondary | ICD-10-CM | POA: Diagnosis not present

## 2014-11-21 DIAGNOSIS — Z1211 Encounter for screening for malignant neoplasm of colon: Secondary | ICD-10-CM

## 2014-11-21 DIAGNOSIS — Z79899 Other long term (current) drug therapy: Secondary | ICD-10-CM

## 2014-11-21 DIAGNOSIS — R030 Elevated blood-pressure reading, without diagnosis of hypertension: Secondary | ICD-10-CM

## 2014-11-21 DIAGNOSIS — Z1159 Encounter for screening for other viral diseases: Secondary | ICD-10-CM

## 2014-11-21 DIAGNOSIS — Q998 Other specified chromosome abnormalities: Secondary | ICD-10-CM | POA: Diagnosis not present

## 2014-11-21 LAB — CBC WITH DIFFERENTIAL/PLATELET
BASOS ABS: 0 10*3/uL (ref 0.0–0.1)
BASOS PCT: 0.5 % (ref 0.0–3.0)
Eosinophils Absolute: 0.1 10*3/uL (ref 0.0–0.7)
Eosinophils Relative: 1.9 % (ref 0.0–5.0)
HEMATOCRIT: 38.4 % (ref 36.0–46.0)
Hemoglobin: 12.7 g/dL (ref 12.0–15.0)
Lymphocytes Relative: 30.8 % (ref 12.0–46.0)
Lymphs Abs: 1.9 10*3/uL (ref 0.7–4.0)
MCHC: 33.2 g/dL (ref 30.0–36.0)
MCV: 92.9 fl (ref 78.0–100.0)
MONOS PCT: 7.8 % (ref 3.0–12.0)
Monocytes Absolute: 0.5 10*3/uL (ref 0.1–1.0)
NEUTROS ABS: 3.6 10*3/uL (ref 1.4–7.7)
NEUTROS PCT: 59 % (ref 43.0–77.0)
Platelets: 315 10*3/uL (ref 150.0–400.0)
RBC: 4.13 Mil/uL (ref 3.87–5.11)
RDW: 12.8 % (ref 11.5–15.5)
WBC: 6.2 10*3/uL (ref 4.0–10.5)

## 2014-11-21 LAB — LIPID PANEL
CHOLESTEROL: 174 mg/dL (ref 0–200)
HDL: 49.8 mg/dL (ref >39.00)
LDL Cholesterol: 97 mg/dL (ref 0–99)
NonHDL: 124.2
Total CHOL/HDL Ratio: 3
Triglycerides: 136 mg/dL (ref 0.0–149.0)
VLDL: 27.2 mg/dL (ref 0.0–40.0)

## 2014-11-21 LAB — COMPREHENSIVE METABOLIC PANEL
ALBUMIN: 4.1 g/dL (ref 3.5–5.2)
ALK PHOS: 60 U/L (ref 39–117)
ALT: 25 U/L (ref 0–35)
AST: 25 U/L (ref 0–37)
BUN: 14 mg/dL (ref 6–23)
CALCIUM: 9.6 mg/dL (ref 8.4–10.5)
CO2: 29 meq/L (ref 19–32)
CREATININE: 0.87 mg/dL (ref 0.40–1.20)
Chloride: 102 mEq/L (ref 96–112)
GFR: 73 mL/min (ref >60.00)
GLUCOSE: 92 mg/dL (ref 70–99)
POTASSIUM: 4.2 meq/L (ref 3.5–5.1)
Sodium: 139 mEq/L (ref 135–145)
Total Bilirubin: 0.5 mg/dL (ref 0.2–1.2)
Total Protein: 7.5 g/dL (ref 6.0–8.3)

## 2014-11-21 LAB — HEMOGLOBIN A1C: HEMOGLOBIN A1C: 5.9 % (ref 4.6–6.5)

## 2014-11-21 LAB — TSH: TSH: 1.44 u[IU]/mL (ref 0.35–4.50)

## 2014-11-21 LAB — VITAMIN D 25 HYDROXY (VIT D DEFICIENCY, FRACTURES): VITD: 24.6 ng/mL — ABNORMAL LOW (ref 30.00–100.00)

## 2014-11-21 NOTE — Patient Instructions (Signed)
I am ordering your mammogram and referring you for colonoscopy.  The  diet I discussed with you today is the 10 day Green Smoothie Cleansing /Detox Diet by Linden Dolin . available on [place] for around $10.  This is not a low carb or a weight loss diet,  It is fundamentally a "cleansing" low fat diet that eliminates sugar, gluten, caffeine, alcohol and dairy for 10 days .  What you add back after the initial ten days is entirely up to  you!  You can expect to lose 5 to 10 lbs depending on how strict you are.   I suggest drinking 2 smoothies daily and keeping one chewable meal (but keep it simple, like baked fish and salad, rice or bok choy) .  You snack primarily on fresh  fruit, egg whites and judicious quantities of nuts. You can add vegetable based protein powder (nothing with whey , since whey is dairy) in it.  [employer] has a Research officer, political party .  I suggest using /drinking 2 smoothies daily and have one sensible low fat meal ,  The snacks are primarily fruit, egg whites and nuts.   It does require some form of a nutrient extractor (Vita Mix, a electric juicer,  Or a Nutribullet Rx).  i have found that using frozen fruits is much more convenient and cost effective. You can even find plenty of organic fruit in the frozen fruit section of [employer]JS's.  Just thaw what you need for the following day the night before in the refrigerator (to avoid jamming up your machine)   The low carb diet is still a great option as well!   This is  my version of a  "Low GI"  Weight loss Diet:  It has enabled many of my patients to lose up to 10 lbs per month depending on how much you restrict your carbs.   follow it carefully.  The idea behind low carb diets is that your body has to take the fat and protein in your food and turn it into an energy that is less efficient than carbohydrates, so you lose weight.    I have listed several choices at each of your 6 meal times to make it easy to shop.and plan    Remember that snack  Substitutions should be equal to or less than 5 NET carbs per serving and  The only carbs in meals come from the pasta or vegetables so, they should be < 15 net carbs. Remember that carbohydrates from fiber do not count, so you can  subtract fiber grams to get the "net carbs " of any particular food item.    All of the foods can be found at grocery stores and in bulk at [employer].  The Atkins protein bars and shakes are available in more varieties at [employer], [employer] and [place].     7 AM Breakfast:  Choose from the following:  Low carbohydrate Protein  Shakes (I recommend the EAS AdvantEdge "Carb Control" shakes, Atkins,  Muscle Milk or Premier Protein shakes  All are < 4  carbs . My favorite is the premier protein chocolate   a scrambled egg/bacon/cheese burrito made with Mission's "carb balance" whole wheat tortilla  (this particular tortilla has only 6 net carbs, once you subtract the fiber grams  )  A slice of home made fritatta (egg based dish without a crust:  google it)    Avoid cereal and bananas, oatmeal and cream of wheat and grits.  They are loaded with carbohydrates and have too few fiber grams    10 AM: high protein snack  Protein bar by Atkins  Or KIND  (the lw sugar variety,  Not all are low , under 200 cal, usually < 6 net carbs).    A stick of cheese:  Around 1 carb,  100 cal      Other so called "protein bars" and Greek yogurts tend to be loaded with carbohydrates.  Remember, in food advertising, the word "energy" is synonymous for " carbohydrate."  Lunch:   A Sandwich using the bread choices listed below,  You  Can use any  Eggs,  lunchmeat, grilled meat or canned tuna), avocado, regular mayo/mustard  and cheese.  A Salad using blue cheese, ranch,  Goddess or vinagrette,  No croutons or "confetti" and no "candied nuts" but regular nuts OK. No "fat free": they add sugar for taste  No pretzels or chips.  Pickles and miniature sweet peppers are a good low carb alternative  that provide a "crunch"   Please Note:  The bread is the only source of carbohydrate in a sandwich and  can be decreased by trying some of these alternatives to traditional loaf bread. Kristopher Oppenheim has the best selection:  Joseph's makes a pita bread and a flat ("Lavash")  bread that are 50 cal and 4 net carbs and are available at [place] and [place].  These can be toasted to make them taste better,  And can be  used as a pita chip to use with hummous as well  Toufayan makes a low carb flatbread that's 100 cal and 9 net carbs available at [employer] and [employer] makes 2 sizes of  Low carb whole wheat tortilla  (The large one is 210 cal and 6 net carbs, the small is 120 cal and also 6 net carbs)  Flat Out makes flatbreads that are low carb as well . They're called "Fold Its")   Avoid "Low fat dressings, as well as Barry Brunner and [place] dressings They are loaded with sugar!   3 PM/ Mid day  Snack:  Consider  1 ounce of  almonds, walnuts, pistachios, pecans, peanuts,  Macadamia nuts or a nut medley are ok .  Avoid "granola"; the dried cranberries and raisins are loaded with carbohydrates. Mixed nuts as long as there are no raisins,  cranberries or dried fruit.    Try the prosciutto/mozzarella cheese sticks by [employer]  In deli /backery section   High protein 80 cal each , 1 carb   To avoid overindulging in snacks: Try drinking a glass of unsweeted almond/coconut milk  Or a cup of coffee with your Atkins chocolate bar to keep you from having 3!!!   Pork rinds!  Yes Pork Rinds  Are on the diet .  They are your potato chip.        6 PM  Dinner:     Meat/fowl/fish with a green salad, with steamed/grilled/sauteed vegggie: broccoli, cauliflower, green beans, spinach, brussel sprouts or  Lima beans. DO NOT BREAD THE PROTEIN!!      There is a low carb pasta by Dreamfield's that is acceptable and tastes great: only 5 digestible carbs/serving.( All grocery stores but [employer]Js carry it )  Try Hurley Cisco  Angelo's chicken piccata or chicken or eggplant parm over low carb pasta.(Lowes and [employer]Js)   Marjory Lies Sanchez's "Carnitas" (pulled pork, no sauce,  0 carbs) can be used to make a dinner  burrito (available  at [employer]'s ) and his pot roast is also without veggies or potatoes   Pesto over low carb pasta (bj's sells a good quality pesto in the center refrigerated section of the deli   Try satueeing  Cheral Marker with mushrooms as another side dish   Whole wheat pasta is still full of digestible carbs and  Not as low in glycemic index as Dreamfield's.   Brown rice is still rice,  So skip the rice and noodles if you eat [place] or [place] (or at least limit to 1 cooked cup)  9 PM snack :   Breyer's "low carb" fudgsicle or  ice cream bar (Carb Smart line), or  Weight Watcher's ice cream bar , or another "no sugar added" ice cream;  a serving of fresh berries/cherries with whipped cream   Cheese or DANNON[employer]S LlG[employer] GREEK YOGURT or the [place] greek yogurt   8 ounces of [place] unsweetened almond/cococunut milk  Cheese and crackers (using WASA crackers,  They are low carb) or peanut butter on low carb crackers or pita bread     Health Maintenance Adopting a healthy lifestyle and getting preventive care can go a long way to promote health and wellness. Talk with your health care provider about what schedule of regular examinations is right for you. This is a good chance for you to check in with your provider about disease prevention and staying healthy. In between checkups, there are plenty of things you can do on your own. Experts have done a lot of research about which lifestyle changes and preventive measures are most likely to keep you healthy. Ask your health care provider for more information. WEIGHT AND DIET  Eat a healthy diet  Be sure to include plenty of vegetables, fruits, low-fat dairy products, and lean protein.  Do not eat a lot of foods high in solid fats, added sugars, or salt.  Get regular  exercise. This is one of the most important things you can do for your health.  Most adults should exercise for at least 150 minutes each week. The exercise should increase your heart rate and make you sweat (moderate-intensity exercise).  Most adults should also do strengthening exercises at least twice a week. This is in addition to the moderate-intensity exercise.  Maintain a healthy weight  Body mass index (BMI) is a measurement that can be used to identify possible weight problems. It estimates body fat based on height and weight. Your health care provider can help determine your BMI and help you achieve or maintain a healthy weight.  For females 74 years of age and older:   A BMI below 18.5 is considered underweight.  A BMI of 18.5 to 24.9 is normal.  A BMI of 25 to 29.9 is considered overweight.  A BMI of 30 and above is considered obese.  Watch levels of cholesterol and blood lipids  You should start having your blood tested for lipids and cholesterol at 51 years of age, then have this test every 5 years.  You may need to have your cholesterol levels checked more often if:  Your lipid or cholesterol levels are high.  You are older than 51 years of age.  You are at high risk for heart disease.  CANCER SCREENING   Lung Cancer  Lung cancer screening is recommended for adults 53-61 years old who are at high risk for lung cancer because of a history of smoking.  A yearly low-dose CT scan of the lungs  is recommended for people who:  Currently smoke.  Have quit within the past 15 years.  Have at least a 30-pack-year history of smoking. A pack year is smoking an average of one pack of cigarettes a day for 1 year.  Yearly screening should continue until it has been 15 years since you quit.  Yearly screening should stop if you develop a health problem that would prevent you from having lung cancer treatment.  Breast Cancer  Practice breast self-awareness. This  means understanding how your breasts normally appear and feel.  It also means doing regular breast self-exams. Let your health care provider know about any changes, no matter how small.  If you are in your 20s or 30s, you should have a clinical breast exam (CBE) by a health care provider every 1-3 years as part of a regular health exam.  If you are 66 or older, have a CBE every year. Also consider having a breast X-ray (mammogram) every year.  If you have a family history of breast cancer, talk to your health care provider about genetic screening.  If you are at high risk for breast cancer, talk to your health care provider about having an MRI and a mammogram every year.  Breast cancer gene (BRCA) assessment is recommended for women who have family members with BRCA-related cancers. BRCA-related cancers include:  Breast.  Ovarian.  Tubal.  Peritoneal cancers.  Results of the assessment will determine the need for genetic counseling and BRCA1 and BRCA2 testing. Cervical Cancer Routine pelvic examinations to screen for cervical cancer are no longer recommended for nonpregnant women who are considered low risk for cancer of the pelvic organs (ovaries, uterus, and vagina) and who do not have symptoms. A pelvic examination may be necessary if you have symptoms including those associated with pelvic infections. Ask your health care provider if a screening pelvic exam is right for you.   The Pap test is the screening test for cervical cancer for women who are considered at risk.  If you had a hysterectomy for a problem that was not cancer or a condition that could lead to cancer, then you no longer need Pap tests.  If you are older than 65 years, and you have had normal Pap tests for the past 10 years, you no longer need to have Pap tests.  If you have had past treatment for cervical cancer or a condition that could lead to cancer, you need Pap tests and screening for cancer for at least 20  years after your treatment.  If you no longer get a Pap test, assess your risk factors if they change (such as having a new sexual partner). This can affect whether you should start being screened again.  Some women have medical problems that increase their chance of getting cervical cancer. If this is the case for you, your health care provider may recommend more frequent screening and Pap tests.  The human papillomavirus (HPV) test is another test that may be used for cervical cancer screening. The HPV test looks for the virus that can cause cell changes in the cervix. The cells collected during the Pap test can be tested for HPV.  The HPV test can be used to screen women 61 years of age and older. Getting tested for HPV can extend the interval between normal Pap tests from three to five years.  An HPV test also should be used to screen women of any age who have unclear Pap test results.  After 51 years of age, women should have HPV testing as often as Pap tests.  Colorectal Cancer  This type of cancer can be detected and often prevented.  Routine colorectal cancer screening usually begins at 51 years of age and continues through 51 years of age.  Your health care provider may recommend screening at an earlier age if you have risk factors for colon cancer.  Your health care provider may also recommend using home test kits to check for hidden blood in the stool.  A small camera at the end of a tube can be used to examine your colon directly (sigmoidoscopy or colonoscopy). This is done to check for the earliest forms of colorectal cancer.  Routine screening usually begins at age 50.  Direct examination of the colon should be repeated every 5-10 years through 51 years of age. However, you may need to be screened more often if early forms of precancerous polyps or small growths are found. Skin Cancer  Check your skin from head to toe regularly.  Tell your health care provider about any  new moles or changes in moles, especially if there is a change in a mole's shape or color.  Also tell your health care provider if you have a mole that is larger than the size of a pencil eraser.  Always use sunscreen. Apply sunscreen liberally and repeatedly throughout the day.  Protect yourself by wearing long sleeves, pants, a wide-brimmed hat, and sunglasses whenever you are outside. HEART DISEASE, DIABETES, AND HIGH BLOOD PRESSURE   Have your blood pressure checked at least every 1-2 years. High blood pressure causes heart disease and increases the risk of stroke.  If you are between 79 years and 39 years old, ask your health care provider if you should take aspirin to prevent strokes.  Have regular diabetes screenings. This involves taking a blood sample to check your fasting blood sugar level.  If you are at a normal weight and have a low risk for diabetes, have this test once every three years after 51 years of age.  If you are overweight and have a high risk for diabetes, consider being tested at a younger age or more often. PREVENTING INFECTION  Hepatitis B  If you have a higher risk for hepatitis B, you should be screened for this virus. You are considered at high risk for hepatitis B if:  You were born in a country where hepatitis B is common. Ask your health care provider which countries are considered high risk.  Your parents were born in a high-risk country, and you have not been immunized against hepatitis B (hepatitis B vaccine).  You have HIV or AIDS.  You use needles to inject street drugs.  You live with someone who has hepatitis B.  You have had sex with someone who has hepatitis B.  You get hemodialysis treatment.  You take certain medicines for conditions, including cancer, organ transplantation, and autoimmune conditions. Hepatitis C  Blood testing is recommended for:  Everyone born from [birth year] through 1967.  Anyone with known risk factors for  hepatitis C. Sexually transmitted infections (STIs)  You should be screened for sexually transmitted infections (STIs) including gonorrhea and chlamydia if:  You are sexually active and are younger than 51 years of age.  You are older than 51 years of age and your health care provider tells you that you are at risk for this type of infection.  Your sexual activity has changed since you were last screened  and you are at an increased risk for chlamydia or gonorrhea. Ask your health care provider if you are at risk.  If you do not have HIV, but are at risk, it may be recommended that you take a prescription medicine daily to prevent HIV infection. This is called pre-exposure prophylaxis (PrEP). You are considered at risk if:  You are sexually active and do not regularly use condoms or know the HIV status of your partner(s).  You take drugs by injection.  You are sexually active with a partner who has HIV. Talk with your health care provider about whether you are at high risk of being infected with HIV. If you choose to begin PrEP, you should first be tested for HIV. You should then be tested every 3 months for as long as you are taking PrEP.  PREGNANCY   If you are premenopausal and you may become pregnant, ask your health care provider about preconception counseling.  If you may become pregnant, take 400 to 800 micrograms (mcg) of folic acid every day.  If you want to prevent pregnancy, talk to your health care provider about birth control (contraception). OSTEOPOROSIS AND MENOPAUSE   Osteoporosis is a disease in which the bones lose minerals and strength with aging. This can result in serious bone fractures. Your risk for osteoporosis can be identified using a bone density scan.  If you are 74 years of age or older, or if you are at risk for osteoporosis and fractures, ask your health care provider if you should be screened.  Ask your health care provider whether you should take a  calcium or vitamin D supplement to lower your risk for osteoporosis.  Menopause may have certain physical symptoms and risks.  Hormone replacement therapy may reduce some of these symptoms and risks. Talk to your health care provider about whether hormone replacement therapy is right for you.  HOME CARE INSTRUCTIONS   Schedule regular health, dental, and eye exams.  Stay current with your immunizations.   Do not use any tobacco products including cigarettes, chewing tobacco, or electronic cigarettes.  If you are pregnant, do not drink alcohol.  If you are breastfeeding, limit how much and how often you drink alcohol.  Limit alcohol intake to no more than 1 drink per day for nonpregnant women. One drink equals 12 ounces of beer, 5 ounces of wine, or 1 ounces of hard liquor.  Do not use street drugs.  Do not share needles.  Ask your health care provider for help if you need support or information about quitting drugs.  Tell your health care provider if you often feel depressed.  Tell your health care provider if you have ever been abused or do not feel safe at home. Document Released: 11/09/2010 Document Revised: 09/10/2013 Document Reviewed: 03/28/2013 [facility] Patient Information 2015 [place], [place]. This information is not intended to replace advice given to you by your health care provider. Make sure you discuss any questions you have with your health care provider.

## 2014-11-21 NOTE — Progress Notes (Signed)
Patient ID: Natalie Pugh, female    DOB: Jul 20, 1965  Age: 49 y.o. MRN: [id number]  The patient is here for annual gyn / wellness examination and management of other chronic and acute problems.    The risk factors are reflected in the social history.  The roster of all physicians providing medical care to patient - is listed in the Snapshot section of the chart.  Activities of daily living:  The patient is 100% independent in all ADLs: dressing, toileting, feeding as well as independent mobility  Home safety : The patient has smoke detectors in the home. They wear seatbelts.  There are no firearms at home. There is no violence in the home.   There is no risks for hepatitis, STDs or HIV. There is no   history of blood transfusion. They have no travel history to infectious disease endemic areas of the world.  The patient has seen their dentist in the last six month. They have seen their eye doctor in the last year. They admit to slight hearing difficulty with regard to whispered voices and some television programs.  They have deferred audiologic testing in the last year.  They do not  have excessive sun exposure. Discussed the need for sun protection: hats, long sleeves and use of sunscreen if there is significant sun exposure.   Diet: the importance of a healthy diet is discussed. They do have a healthy diet.  The benefits of regular aerobic exercise were discussed. She walks 4 times per week ,  20 minutes.   Depression screen: there are no signs or vegative symptoms of depression- irritability, change in appetite, anhedonia, sadness/tearfullness.  Cognitive assessment: the patient manages all their financial and personal affairs and is actively engaged. They could relate day,date,year and events; recalled 2/3 objects at 3 minutes; performed clock-face test normally.  The following portions of the patient's history were reviewed and updated as appropriate: allergies, current medications,  past family history, past medical history,  past surgical history, past social history  and problem list.  Visual acuity was not assessed per patient preference since she has regular follow up with her ophthalmologist. Hearing and body mass index were assessed and reviewed.   During the course of the visit the patient was educated and counseled about appropriate screening and preventive services including : fall prevention , diabetes screening, nutrition counseling, colorectal cancer screening, and recommended immunizations.    CC: The primary encounter diagnosis was Impaired fasting glucose. 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   She has become perimenopausal , one period in 6 moths,  Having hot flashes,  Menses was very heavy   Her MS has been progressing  , battling some seld described "self pity" and depressive symptoms  Stopped lexapro,  Using alprazolam prn very minimally for 2 am wakeups and panic feelings  Gaining weight bc sh eis not not exercising . Has a recumbent bike     History Natalie Pugh has a past medical history of Multiple sclerosis, relapsing-remitting (2001); Diabetes mellitus; Hypertension; and Anxiety.   She has no past surgical history on file.   Her family history includes COPD in her mother; Cancer (age of onset: 22) in her maternal  aunt; Heart disease in her mother; Hyperlipidemia in her paternal uncle.She reports that she quit smoking about 21 years ago. Her smoking use included Cigarettes. She has never used smokeless tobacco. She reports that she drinks alcohol. She reports that she does  not use illicit drugs.  Outpatient Prescriptions Prior to Visit  Medication Sig Dispense Refill  . ALPRAZolam (XANAX) 1 MG tablet Take 1 tablet (1 mg total) by mouth 2 (two) times daily as needed for anxiety or sleep. 60 tablet 3  . Cholecalciferol (VITAMIN D3) 1000 UNITS CAPS Take by mouth.    . metoprolol succinate (TOPROL-XL) 50 MG 24 hr tablet TAKE 1 TABLET (50 MG TOTAL) BY MOUTH DAILY. TAKE WITH OR IMMEDIATELY FOLLOWING A MEAL. 90 tablet 3  . Multiple Vitamin (MULTIVITAMIN) tablet Take 1 tablet by mouth daily.    Marland Kitchen oxybutynin (DITROPAN-XL) 5 MG 24 hr tablet Take 5 mg by mouth daily as needed.     . REBIF 44 MCG/0.5ML injection INJECT 1 PREFILLED SYRINGE SUBCUTANEOUSLY 3 TIMES PER WEEK AS DIRECTED 18 mL 0  . ciprofloxacin (CIPRO) 500 MG tablet Take 0.5 tablets (250 mg total) by mouth 2 (two) times daily. 6 tablet 0  . escitalopram (LEXAPRO) 20 MG tablet Take 1 tablet (20 mg total) by mouth daily. (Patient not taking: Reported on 11/21/2014) 30 tablet 0  . Lactulose 20 GM/30ML SOLN Take 30 mLs (20 g total) by mouth as needed (constipatio n). (Patient not taking: Reported on 11/21/2014) 240 mL 2   No facility-administered medications prior to visit.    Review of Systems   Patient denies headache, fevers, malaise, unintentional weight loss, skin rash, eye pain, sinus congestion and sinus pain, sore throat, dysphagia,  hemoptysis , cough, dyspnea, wheezing, chest pain, palpitations, orthopnea, edema, abdominal pain, nausea, melena, diarrhea, constipation, flank pain, dysuria, hematuria, urinary  Frequency, nocturia, , seizures,   Loss of consciousness,  Tremor, insomnia, depression, anxiety, and suicidal ideation.      Objective:  BP 138/90 mmHg  Pulse 86  Temp(Src) 97.6 F (36.4 C) (Oral)  Resp 14  Ht 5' 5.75" (1.67 m)  Wt 233 lb 12 oz (106.028 kg)  BMI 38.02 kg/m2  SpO2 98%  LMP 09/28/2014 (Exact Date)  Physical Exam   General Appearance:    Alert, cooperative, no distress,  appears stated age  Head:    Normocephalic, without obvious abnormality, atraumatic  Eyes:    PERRL, conjunctiva/corneas clear, EOM's intact, fundi    benign, both eyes  Ears:    Normal TM's and external ear canals, both ears  Nose:   Nares normal, septum midline, mucosa normal, no drainage    or sinus tenderness  Throat:   Lips, mucosa, and tongue normal; teeth and gums normal  Neck:   Supple, symmetrical, trachea midline, no adenopathy;    thyroid:  no enlargement/tenderness/nodules; no carotid   bruit or JVD  Back:     Symmetric, no curvature, ROM normal, no CVA tenderness  Lungs:     Clear to auscultation bilaterally, respirations unlabored  Chest Wall:    No tenderness or deformity   Heart:    Regular rate and rhythm, S1 and S2 normal, no murmur, rub   or gallop  Breast Exam:    No tenderness, masses, or nipple abnormality  Abdomen:     Soft, non-tender, bowel sounds active all four quadrants,    no masses, no organomegaly  Genitalia:    Pelvic: cervix normal in appearance, external genitalia normal, no adnexal masses  or tenderness, no cervical motion tenderness, rectovaginal septum normal, uterus normal size, shape, and consistency and vagina normal without discharge  Extremities:   Extremities normal, atraumatic, no cyanosis or edema  Pulses:   2+ and symmetric all extremities  Skin:   Skin color, texture, turgor normal, no rashes or lesions  Lymph nodes:   Cervical, supraclavicular, and axillary nodes normal  Neurologic:   CNII-XII intact, normal strength, sensation and reflexes    throughout      Assessment & Plan:   Problem List Items Addressed This Visit      Unprioritized   White coat syndrome without diagnosis of hypertension    BP 138/90 mmHg  Pulse 86  Temp(Src) 97.6 F (36.4 C) (Oral)  Resp 14  Ht 5' 5.75" (1.67 m)  Wt 233 lb 12 oz (106.028 kg)  BMI 38.02 kg/m2  SpO2 98%  LMP 09/28/2014 (Exact Date)   Elevation needs follow up with home /office readings.        Obesity (BMI 30-39.9)    I have addressed  BMI and recommended wt loss of 10% of body weight over the next 6 months using a low glycemic index diet and regular exercise a minimum of 5 days per week.        Impaired fasting glucose - Primary    She has no signs of DM by current labs. o changes today.  Lab Results  Component Value Date   CREATININE 0.87 11/21/2014   Lab Results  Component Value Date   NA 139 11/21/2014   K 4.2 11/21/2014   CL 102 11/21/2014   CO2 29 11/21/2014   Lab Results  Component Value Date   HGBA1C 5.9 11/21/2014            Relevant Orders   Lipid panel (Completed)   Hemoglobin A1c (Completed)   Encounter for preventive health examination    Annual wellness  exam was done as well as a comprehensive physical exam and management of acute and chronic conditions .  During the course of the visit the patient was educated and counseled about appropriate screening and preventive services including :  diabetes screening, lipid analysis with projected  10 year  risk for CAD , nutrition counseling, colorectal cancer screening, and recommended immunizations.  Printed recommendations for health maintenance screenings was given.         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)        Relevant Orders    Lipid panel (Completed)    TSH (Completed)    Long-term use of high-risk medication        Relevant Orders    CBC with Differential/Platelet (Completed)    Comprehensive metabolic panel (Completed)    Vitamin D deficiency        Relevant Orders    Vit D  25 hydroxy (rtn osteoporosis monitoring) (Completed)    Need for hepatitis C screening test        Relevant Orders    Hepatitis C antibody (Completed)    Screening for cervical cancer        Relevant Orders    Cytology - PAP    Breast cancer screening        Relevant Orders    MM DIGITAL SCREENING BILATERAL    Colon cancer screening         Relevant Orders    Ambulatory referral to General Surgery  I have discontinued Ms. Cleaver Lactulose, ciprofloxacin, and escitalopram. I am also having her maintain her oxybutynin, Vitamin D3, multivitamin, REBIF, ALPRAZolam, and metoprolol succinate.  No orders of the defined types were placed in this encounter.    Medications Discontinued During This Encounter  Medication Reason  . ciprofloxacin (CIPRO) 500 MG tablet Completed Course  . Lactulose 20 GM/30ML SOLN   . escitalopram (LEXAPRO) 20 MG tablet     Follow-up: Return in about 6 months (around 05/24/2015).   Sherlene Shams, MD

## 2014-11-21 NOTE — Progress Notes (Signed)
Pre-visit discussion using our clinic review tool. No additional management support is needed unless otherwise documented below in the visit note.  

## 2014-11-22 LAB — HEPATITIS C ANTIBODY: HCV AB: NEGATIVE

## 2014-11-24 ENCOUNTER — Encounter: Payer: Self-pay | Admitting: Internal Medicine

## 2014-11-24 DIAGNOSIS — Z0000 Encounter for general adult medical examination without abnormal findings: Secondary | ICD-10-CM | POA: Insufficient documentation

## 2014-11-24 NOTE — Assessment & Plan Note (Signed)
BP 138/90 mmHg  Pulse 86  Temp(Src) 97.6 F (36.4 C) (Oral)  Resp 14  Ht 5' 5.75" (1.67 m)  Wt 233 lb 12 oz (106.028 kg)  BMI 38.02 kg/m2  SpO2 98%  LMP 09/28/2014 (Exact Date)   Elevation needs follow up with home /office readings.

## 2014-11-24 NOTE — Assessment & Plan Note (Signed)
I have addressed  BMI and recommended wt loss of 10% of body weight over the next 6 months using a low glycemic index diet and regular exercise a minimum of 5 days per week.   

## 2014-11-24 NOTE — Assessment & Plan Note (Signed)

## 2014-11-24 NOTE — Assessment & Plan Note (Addendum)
She has no signs of DM by current labs. o changes today.  Lab Results  Component Value Date   CREATININE 0.87 11/21/2014   Lab Results  Component Value Date   NA 139 11/21/2014   K 4.2 11/21/2014   CL 102 11/21/2014   CO2 29 11/21/2014   Lab Results  Component Value Date   HGBA1C 5.9 11/21/2014

## 2014-11-25 ENCOUNTER — Encounter: Payer: Self-pay | Admitting: *Deleted

## 2014-11-25 LAB — CYTOLOGY - PAP

## 2014-11-27 ENCOUNTER — Encounter: Payer: Self-pay | Admitting: Internal Medicine

## 2014-11-28 ENCOUNTER — Telehealth: Payer: Self-pay | Admitting: *Deleted

## 2014-11-28 DIAGNOSIS — G4700 Insomnia, unspecified: Secondary | ICD-10-CM

## 2014-11-28 MED ORDER — ALPRAZOLAM 1 MG PO TABS: 1.0000 mg | ORAL_TABLET | Freq: Two times a day (BID) | ORAL | Status: DC | PRN

## 2014-11-28 MED ORDER — METOPROLOL SUCCINATE ER 50 MG PO TB24: ORAL_TABLET | ORAL | Status: DC

## 2014-11-28 NOTE — Telephone Encounter (Signed)
Pt called states she was to believe at her appoint that Metoprolol and Xanax would be sent to pharmacy.  Please advise

## 2014-11-28 NOTE — Telephone Encounter (Signed)
Ok to refill,  printed rx  

## 2014-11-29 NOTE — Telephone Encounter (Signed)
rx faxed

## 2014-12-02 ENCOUNTER — Other Ambulatory Visit: Payer: Self-pay | Admitting: Internal Medicine

## 2014-12-02 ENCOUNTER — Encounter: Payer: Self-pay | Admitting: General Surgery

## 2014-12-02 ENCOUNTER — Ambulatory Visit (INDEPENDENT_AMBULATORY_CARE_PROVIDER_SITE_OTHER): Payer: BLUE CROSS/BLUE SHIELD | Admitting: General Surgery

## 2014-12-02 ENCOUNTER — Ambulatory Visit
Admission: RE | Admit: 2014-12-02 | Discharge: 2014-12-02 | Disposition: A | Discharge status: Home / Self Care | Payer: BLUE CROSS/BLUE SHIELD | Source: Ambulatory Visit | Attending: Internal Medicine | Admitting: Internal Medicine

## 2014-12-02 VITALS — BP 128/70 | HR 68 | Resp 12 | Ht 66.0 in | Wt 236.0 lb

## 2014-12-02 DIAGNOSIS — Z1239 Encounter for other screening for malignant neoplasm of breast: Secondary | ICD-10-CM

## 2014-12-02 DIAGNOSIS — Z1231 Encounter for screening mammogram for malignant neoplasm of breast: Secondary | ICD-10-CM | POA: Diagnosis present

## 2014-12-02 DIAGNOSIS — R928 Other abnormal and inconclusive findings on diagnostic imaging of breast: Secondary | ICD-10-CM | POA: Diagnosis not present

## 2014-12-02 DIAGNOSIS — Z1211 Encounter for screening for malignant neoplasm of colon: Secondary | ICD-10-CM | POA: Diagnosis not present

## 2014-12-02 MED ORDER — PEG 3350-KCL-NA BICARB-NACL 420 G PO SOLR: ORAL | Status: DC

## 2014-12-02 NOTE — Progress Notes (Signed)
Patient ID: SUAD JANSSON, female   DOB: [birth year], 49 y.o.   MRN: [id number]  Chief Complaint  Patient presents with  . Colonoscopy    HPI Natalie Pugh is a 49 y.o. female here today for a evaluation of a screening colonoscopy. Patient states she is always constipation. Bowel movements are daily or up to every three days. Patient has a neurogenic bladder and cath's about eight times daily.  HPI  Past Medical History  Diagnosis Date  . Multiple sclerosis, relapsing-remitting 2001    managed since 2001,  present since 1994,  Dr. Gardiner Ramus Neurolgoy  . Diabetes mellitus   . Hypertension   . Anxiety   . Neurogenic bladder     No past surgical history on file.  Family History  Problem Relation Age of Onset  . Heart disease Mother     atrial fibrillation  . COPD Mother   . Cancer Maternal Aunt 60    breast cancer  . Breast cancer Maternal Aunt 60  . Hyperlipidemia Paternal Uncle   .       Social History History  Substance Use Topics  . Smoking status: Former Smoker    Types: Cigarettes    Quit date: 09/22/1993  . Smokeless tobacco: Never Used  . Alcohol Use: Yes    No Known Allergies  Current Outpatient Prescriptions  Medication Sig Dispense Refill  . ALPRAZolam (XANAX) 1 MG tablet Take 1 tablet (1 mg total) by mouth 2 (two) times daily as needed for anxiety or sleep. 60 tablet 3  . Cholecalciferol (VITAMIN D3) 1000 UNITS CAPS Take by mouth.    . metoprolol succinate (TOPROL-XL) 50 MG 24 hr tablet TAKE 1 TABLET (50 MG TOTAL) BY MOUTH DAILY. 90 tablet 3  . Multiple Vitamin (MULTIVITAMIN) tablet Take 1 tablet by mouth daily.    Marland Kitchen oxybutynin (DITROPAN-XL) 5 MG 24 hr tablet Take 5 mg by mouth daily as needed.     . REBIF 44 MCG/0.5ML injection INJECT 1 PREFILLED SYRINGE SUBCUTANEOUSLY 3 TIMES PER WEEK AS DIRECTED 18 mL 0  . polyethylene glycol-electrolytes (NULYTELY/GOLYTELY) 420 G solution one bottle for colonoscopy prep 4000 mL 0   No current  facility-administered medications for this visit.    Review of Systems Review of Systems  Constitutional: Negative.   Respiratory: Negative.   Cardiovascular: Negative.   Gastrointestinal: Positive for constipation.    Blood pressure 128/70, pulse 68, resp. rate 12, height 5\' 6"  (1.676 m), weight 236 lb (107.049 kg), last menstrual period 09/28/2014.  Physical Exam Physical Exam  Constitutional: She is oriented to person, place, and time. She appears well-developed and well-nourished.  Cardiovascular: Normal rate, regular rhythm and normal heart sounds.   Pulmonary/Chest: Effort normal and breath sounds normal.  Neurological: She is alert and oriented to person, place, and time.  Skin: Skin is warm and dry.    Data Reviewed PCP note of 11/21/2014.   Assessment    Candidate for screening colonoscopy.    Plan    The patient reports that she has occasional constipation requiring manual disimpaction. She also has impaired sphincter control (she needs to self cath for bladder function)  and has had occasional episodes of fecal incontinence.    Colonoscopy with possible biopsy/polypectomy prn: Information regarding the procedure, including its potential risks and complications (including but not limited to perforation of the bowel, which may require emergency surgery to repair, and bleeding) was verbally given to the patient. Educational information regarding lower intestinal endoscopy was given  to the patient. Written instructions for how to complete the bowel prep using Miralax were provided. The importance of drinking ample fluids to avoid dehydration as a result of the prep emphasized.  Patient has been scheduled for a colonoscopy on 01-29-15 at [facility]. This patient has been asked to take Dulcolax 10 mg the evening prior to GoLytely prep.  PCP:  Staci Acosta 12/03/2014, 5:25 PM

## 2014-12-02 NOTE — Patient Instructions (Addendum)
Colonoscopy A colonoscopy is an exam to look at the entire large intestine (colon). This exam can help find problems such as tumors, polyps, inflammation, and areas of bleeding. The exam takes about 1 hour.  LET [facility] CARE PROVIDER KNOW ABOUT:   Any allergies you have.  All medicines you are taking, including vitamins, herbs, eye drops, creams, and over-the-counter medicines.  Previous problems you or members of your family have had with the use of anesthetics.  Any blood disorders you have.  Previous surgeries you have had.  Medical conditions you have. RISKS AND COMPLICATIONS  Generally, this is a safe procedure. However, as with any procedure, complications can occur. Possible complications include:  Bleeding.  Tearing or rupture of the colon wall.  Reaction to medicines given during the exam.  Infection (rare). BEFORE THE PROCEDURE   Ask your health care provider about changing or stopping your regular medicines.  You may be prescribed an oral bowel prep. This involves drinking a large amount of medicated liquid, starting the day before your procedure. The liquid will cause you to have multiple loose stools until your stool is almost clear or light green. This cleans out your colon in preparation for the procedure.  Do not eat or drink anything else once you have started the bowel prep, unless your health care provider tells you it is safe to do so.  Arrange for someone to drive you home after the procedure. PROCEDURE   You will be given medicine to help you relax (sedative).  You will lie on your side with your knees bent.  A long, flexible tube with a light and camera on the end (colonoscope) will be inserted through the rectum and into the colon. The camera sends video back to a computer screen as it moves through the colon. The colonoscope also releases carbon dioxide gas to inflate the colon. This helps your health care provider see the area better.  During  the exam, your health care provider may take a small tissue sample (biopsy) to be examined under a microscope if any abnormalities are found.  The exam is finished when the entire colon has been viewed. AFTER THE PROCEDURE   Do not drive for 24 hours after the exam.  You may have a small amount of blood in your stool.  You may pass moderate amounts of gas and have mild abdominal cramping or bloating. This is caused by the gas used to inflate your colon during the exam.  Ask when your test results will be ready and how you will get your results. Make sure you get your test results. Document Released: 04/23/2000 Document Revised: 02/14/2013 Document Reviewed: 01/01/2013 [facility] Patient Information 2015 [place], [place]. This information is not intended to replace advice given to you by your health care provider. Make sure you discuss any questions you have with your health care provider.  Patient has been scheduled for a colonoscopy on 01-29-15 at [facility]. This patient has been asked to take Dulcolax 10 mg the evening prior to GoLytely prep.

## 2014-12-03 ENCOUNTER — Other Ambulatory Visit: Payer: Self-pay | Admitting: Internal Medicine

## 2014-12-03 DIAGNOSIS — R928 Other abnormal and inconclusive findings on diagnostic imaging of breast: Secondary | ICD-10-CM

## 2014-12-03 DIAGNOSIS — N6489 Other specified disorders of breast: Secondary | ICD-10-CM

## 2014-12-03 DIAGNOSIS — Z1211 Encounter for screening for malignant neoplasm of colon: Secondary | ICD-10-CM | POA: Insufficient documentation

## 2014-12-09 ENCOUNTER — Ambulatory Visit: Payer: BLUE CROSS/BLUE SHIELD

## 2014-12-09 ENCOUNTER — Ambulatory Visit
Admission: RE | Admit: 2014-12-09 | Discharge: 2014-12-09 | Disposition: A | Discharge status: Home / Self Care | Payer: BLUE CROSS/BLUE SHIELD | Source: Ambulatory Visit | Attending: Internal Medicine | Admitting: Internal Medicine

## 2014-12-09 DIAGNOSIS — N6489 Other specified disorders of breast: Secondary | ICD-10-CM | POA: Diagnosis present

## 2014-12-09 DIAGNOSIS — R928 Other abnormal and inconclusive findings on diagnostic imaging of breast: Secondary | ICD-10-CM | POA: Diagnosis not present

## 2015-01-06 ENCOUNTER — Other Ambulatory Visit: Payer: Self-pay | Admitting: General Surgery

## 2015-01-06 DIAGNOSIS — Z1211 Encounter for screening for malignant neoplasm of colon: Secondary | ICD-10-CM

## 2015-01-06 NOTE — H&P (Signed)
Patient ID: Natalie Pugh, female DOB: 1965/10/03, 49 y.o. MRN: [id number]  Chief Complaint  Patient presents with  . Colonoscopy    HPI Natalie Pugh is a 49 y.o. female here today for a evaluation of a screening colonoscopy. Patient states she is always constipation. Bowel movements are daily or up to every three days. Patient has a neurogenic bladder and cath's about eight times daily.  HPI  Past Medical History  Diagnosis Date  . Multiple sclerosis, relapsing-remitting 2001    managed since 2001, present since 1994, Dr. Gardiner Ramus Neurolgoy  . Diabetes mellitus   . Hypertension   . Anxiety   . Neurogenic bladder     No past surgical history on file.  Family History  Problem Relation Age of Onset  . Heart disease Mother     atrial fibrillation  . COPD Mother   . Cancer Maternal Aunt 60    breast cancer  . Breast cancer Maternal Aunt 60  . Hyperlipidemia Paternal Uncle   .       Social History History  Substance Use Topics  . Smoking status: Former Smoker    Types: Cigarettes    Quit date: 09/22/1993  . Smokeless tobacco: Never Used  . Alcohol Use: Yes    No Known Allergies  Current Outpatient Prescriptions  Medication Sig Dispense Refill  . ALPRAZolam (XANAX) 1 MG tablet Take 1 tablet (1 mg total) by mouth 2 (two) times daily as needed for anxiety or sleep. 60 tablet 3  . Cholecalciferol (VITAMIN D3) 1000 UNITS CAPS Take by mouth.    . metoprolol succinate (TOPROL-XL) 50 MG 24 hr tablet TAKE 1 TABLET (50 MG TOTAL) BY MOUTH DAILY. 90 tablet 3  . Multiple Vitamin (MULTIVITAMIN) tablet Take 1 tablet by mouth daily.    Marland Kitchen oxybutynin (DITROPAN-XL) 5 MG 24 hr tablet Take 5 mg by mouth daily as needed.     . REBIF 44 MCG/0.5ML injection INJECT 1 PREFILLED SYRINGE SUBCUTANEOUSLY 3 TIMES PER WEEK AS DIRECTED 18 mL 0  . polyethylene  glycol-electrolytes (NULYTELY/GOLYTELY) 420 G solution one bottle for colonoscopy prep 4000 mL 0   No current facility-administered medications for this visit.    Review of Systems Review of Systems  Constitutional: Negative.  Respiratory: Negative.  Cardiovascular: Negative.  Gastrointestinal: Positive for constipation.    Blood pressure 128/70, pulse 68, resp. rate 12, height 5\' 6"  (1.676 m), weight 236 lb (107.049 kg), last menstrual period 09/28/2014.  Physical Exam Physical Exam  Constitutional: She is oriented to person, place, and time. She appears well-developed and well-nourished.  Cardiovascular: Normal rate, regular rhythm and normal heart sounds.  Pulmonary/Chest: Effort normal and breath sounds normal.  Neurological: She is alert and oriented to person, place, and time.  Skin: Skin is warm and dry.    Data Reviewed PCP note of 11/21/2014.   Assessment    Candidate for screening colonoscopy.    Plan    The patient reports that she has occasional constipation requiring manual disimpaction. She also has impaired sphincter control (she needs to self cath for bladder function) and has had occasional episodes of fecal incontinence.    Colonoscopy with possible biopsy/polypectomy prn: Information regarding the procedure, including its potential risks and complications (including but not limited to perforation of the bowel, which may require emergency surgery to repair, and bleeding) was verbally given to the patient. Educational information regarding lower intestinal endoscopy was given to the patient. Written instructions for how to complete the bowel  prep using Miralax were provided. The importance of drinking ample fluids to avoid dehydration as a result of the prep emphasized.  Patient has been scheduled for a colonoscopy on 01-29-15 at [facility]. This patient has been asked to take Dulcolax 10 mg the evening prior to GoLytely prep.  PCP: Staci Acosta

## 2015-01-27 ENCOUNTER — Telehealth: Payer: Self-pay | Admitting: *Deleted

## 2015-01-27 NOTE — Telephone Encounter (Signed)
Patient confirms no medication changes since last office visit.   We will proceed with colonoscopy as scheduled for 01-29-15.  Patient instructed to call the office if she has further questions.

## 2015-01-29 ENCOUNTER — Ambulatory Visit
Admission: RE | Admit: 2015-01-29 | Discharge: 2015-01-29 | Disposition: A | Discharge status: Home / Self Care | Payer: BLUE CROSS/BLUE SHIELD | Source: Ambulatory Visit | Attending: General Surgery | Admitting: General Surgery

## 2015-01-29 ENCOUNTER — Encounter: Payer: Self-pay | Admitting: *Deleted

## 2015-01-29 ENCOUNTER — Ambulatory Visit: Payer: BLUE CROSS/BLUE SHIELD | Admitting: Anesthesiology

## 2015-01-29 ENCOUNTER — Encounter
Admission: RE | Disposition: A | Discharge status: Home / Self Care | Payer: Self-pay | Source: Ambulatory Visit | Attending: General Surgery

## 2015-01-29 DIAGNOSIS — Z1211 Encounter for screening for malignant neoplasm of colon: Secondary | ICD-10-CM | POA: Insufficient documentation

## 2015-01-29 DIAGNOSIS — G35 Multiple sclerosis: Secondary | ICD-10-CM | POA: Diagnosis not present

## 2015-01-29 DIAGNOSIS — Z79899 Other long term (current) drug therapy: Secondary | ICD-10-CM | POA: Insufficient documentation

## 2015-01-29 DIAGNOSIS — N390 Urinary tract infection, site not specified: Secondary | ICD-10-CM | POA: Insufficient documentation

## 2015-01-29 DIAGNOSIS — I10 Essential (primary) hypertension: Secondary | ICD-10-CM | POA: Insufficient documentation

## 2015-01-29 DIAGNOSIS — F419 Anxiety disorder, unspecified: Secondary | ICD-10-CM | POA: Insufficient documentation

## 2015-01-29 DIAGNOSIS — I471 Supraventricular tachycardia: Secondary | ICD-10-CM | POA: Insufficient documentation

## 2015-01-29 DIAGNOSIS — Z87891 Personal history of nicotine dependence: Secondary | ICD-10-CM | POA: Insufficient documentation

## 2015-01-29 DIAGNOSIS — N319 Neuromuscular dysfunction of bladder, unspecified: Secondary | ICD-10-CM | POA: Diagnosis not present

## 2015-01-29 DIAGNOSIS — H469 Unspecified optic neuritis: Secondary | ICD-10-CM | POA: Diagnosis not present

## 2015-01-29 DIAGNOSIS — E119 Type 2 diabetes mellitus without complications: Secondary | ICD-10-CM | POA: Insufficient documentation

## 2015-01-29 HISTORY — DX: Supraventricular tachycardia: I47.1

## 2015-01-29 HISTORY — PX: COLONOSCOPY WITH PROPOFOL: SHX5780

## 2015-01-29 HISTORY — DX: Supraventricular tachycardia, unspecified: I47.10

## 2015-01-29 SURGERY — COLONOSCOPY WITH PROPOFOL
Anesthesia: General

## 2015-01-29 MED ORDER — PROPOFOL 10 MG/ML IV BOLUS
INTRAVENOUS | Status: DC | PRN
  Administered 2015-01-29: 80 mg via INTRAVENOUS
  Administered 2015-01-29: 10 mg via INTRAVENOUS

## 2015-01-29 MED ORDER — LIDOCAINE HCL (CARDIAC) 20 MG/ML IV SOLN
INTRAVENOUS | Status: DC | PRN
  Administered 2015-01-29: 60 mg via INTRAVENOUS

## 2015-01-29 MED ORDER — PROPOFOL INFUSION 10 MG/ML OPTIME
INTRAVENOUS | Status: DC | PRN
  Administered 2015-01-29: 120 ug/kg/min via INTRAVENOUS

## 2015-01-29 MED ORDER — SODIUM CHLORIDE 0.9 % IV SOLN
INTRAVENOUS | Status: DC
  Administered 2015-01-29: 1000 mL via INTRAVENOUS

## 2015-01-29 NOTE — Anesthesia Postprocedure Evaluation (Signed)
  Anesthesia Post-op Note  Patient: Natalie Pugh  Procedure(s) Performed: Procedure(s): COLONOSCOPY WITH PROPOFOL (N/A)  Anesthesia type:General  Patient location: PACU  Post pain: Pain level controlled  Post assessment: Post-op Vital signs reviewed, Patient's Cardiovascular Status Stable, Respiratory Function Stable, Patent Airway and No signs of Nausea or vomiting  Post vital signs: Reviewed and stable  Last Vitals:  Filed Vitals:   01/29/15 1030  BP: 120/88  Pulse: 71  Temp:   Resp: 12    Level of consciousness: awake, alert  and patient cooperative  Complications: No apparent anesthesia complications

## 2015-01-29 NOTE — Op Note (Signed)
[facility] Patient Name: Natalie Pugh Procedure Date: 01/29/2015 9:16 AM MRN: [id number] Account #: [id number] Date of Birth: 08-15-1965 Admit Type: Outpatient Age: 49 Room: [facility] ENDO ROOM 1 Gender: Female Note Status: Finalized Procedure:         Colonoscopy Indications:       Screening for colorectal malignant neoplasm Providers:         Earline Mayotte, MD Referring MD:      Duncan Dull, MD (Referring MD) Medicines:         Monitored Anesthesia Care Complications:     No immediate complications. Procedure:         Pre-Anesthesia Assessment:                    - Prior to the procedure, a History and Physical was                     performed, and patient medications, allergies and                     sensitivities were reviewed. The patient's tolerance of                     previous anesthesia was reviewed.                    - The risks and benefits of the procedure and the sedation                     options and risks were discussed with the patient. All                     questions were answered and informed consent was obtained.                    After obtaining informed consent, the colonoscope was                     passed under direct vision. Throughout the procedure, the                     patient's blood pressure, pulse, and oxygen saturations                     were monitored continuously. The Colonoscope was                     introduced through the anus and advanced to the the cecum,                     identified by appendiceal orifice and ileocecal valve. The                     colonoscopy was performed without difficulty. The patient                     tolerated the procedure well. The quality of the bowel                     preparation was excellent. Findings:      The entire examined colon appeared normal on direct and retroflexion       views. Impression:        - The entire examined colon is normal on  direct and  retroflexion views.                    - No specimens collected. Recommendation:    - Repeat colonoscopy in 10 years for screening purposes. Diagnosis Code(s): --- Professional ---                    Z12.11, Encounter for screening for malignant neoplasm of                     colon Earline Mayotte, MD 01/29/2015 9:53:30 AM This report has been signed electronically. Number of Addenda: 0 Note Initiated On: 01/29/2015 9:16 AM Scope Withdrawal Time: 0 hours 9 minutes 32 seconds  Total Procedure Duration: 0 hours 18 minutes 1 second       [facility]

## 2015-01-29 NOTE — Transfer of Care (Signed)
Immediate Anesthesia Transfer of Care Note  Patient: Natalie Pugh  Procedure(s) Performed: Procedure(s): COLONOSCOPY WITH PROPOFOL (N/A)  Patient Location: PACU  Anesthesia Type:General  Level of Consciousness: awake  Airway & Oxygen Therapy: Patient Spontanous Breathing  Post-op Assessment: Report given to RN and Post -op Vital signs reviewed and stable  Post vital signs: Reviewed and stable  Last Vitals:  Filed Vitals:   01/29/15 0958  BP: 106/69  Pulse: 74  Temp:   Resp: 16    Complications: No apparent anesthesia complications

## 2015-01-29 NOTE — Anesthesia Preprocedure Evaluation (Addendum)
Anesthesia Evaluation  Patient identified by MRN, date of birth, ID band Patient awake    Reviewed: Allergy & Precautions, H&P , NPO status , Patient's Chart, lab work & pertinent test results  Airway Mallampati: III  TM Distance: >3 FB Neck ROM: full    Dental no notable dental hx. (+) Teeth Intact   Pulmonary neg shortness of breath, former smoker,    Pulmonary exam normal breath sounds clear to auscultation       Cardiovascular Exercise Tolerance: Poor hypertension, (-) Past MI Normal cardiovascular exam Rhythm:regular Rate:Normal     Neuro/Psych PSYCHIATRIC DISORDERS Anxiety negative neurological ROS  negative psych ROS   GI/Hepatic negative GI ROS, Neg liver ROS, neg GERD  ,  Endo/Other  negative endocrine ROSdiabetes  Renal/GU negative Renal ROS  negative genitourinary   Musculoskeletal   Abdominal   Peds  Hematology negative hematology ROS (+)   Anesthesia Other Findings Past Medical History:   Multiple sclerosis, relapsing-remitting         2001           Comment:managed since 2001,  present since 1994,  Dr.               Gardiner Ramus Neurolgoy   Hypertension                                                 Anxiety                                                      Neurogenic bladder                                           Diabetes mellitus                                            SVT (supraventricular tachycardia)                           Reproductive/Obstetrics negative OB ROS                           Anesthesia Physical Anesthesia Plan  ASA: III  Anesthesia Plan: General   Post-op Pain Management:    Induction:   Airway Management Planned:   Additional Equipment:   Intra-op Plan:   Post-operative Plan:   Informed Consent: I have reviewed the patients History and Physical, chart, labs and discussed the procedure including the risks, benefits and  alternatives for the proposed anesthesia with the patient or authorized representative who has indicated his/her understanding and acceptance.   Dental Advisory Given  Plan Discussed with: Anesthesiologist, CRNA and Surgeon  Anesthesia Plan Comments: (Patient consented for risk of MS flare. Patient voiced understanding.)       Anesthesia Quick Evaluation

## 2015-01-30 ENCOUNTER — Encounter: Payer: Self-pay | Admitting: General Surgery

## 2015-05-26 ENCOUNTER — Ambulatory Visit: Payer: BLUE CROSS/BLUE SHIELD | Admitting: Internal Medicine

## 2015-12-02 ENCOUNTER — Other Ambulatory Visit: Payer: Self-pay

## 2015-12-02 MED ORDER — METOPROLOL SUCCINATE ER 50 MG PO TB24: ORAL_TABLET | ORAL | 0 refills | Status: DC

## 2015-12-02 NOTE — Telephone Encounter (Signed)
LOV more than 1 year ago, and no OV scheduled. Allene Dillon, CMA

## 2015-12-02 NOTE — Telephone Encounter (Signed)
RefillED for 30 days only.  OFFICE VISIT NEEDED prior to any more refills 

## 2016-01-08 ENCOUNTER — Telehealth: Payer: Self-pay

## 2016-01-08 ENCOUNTER — Other Ambulatory Visit: Payer: Self-pay | Admitting: Internal Medicine

## 2016-01-08 NOTE — Telephone Encounter (Signed)
Pt was denied metoprolol refill because she never scheduled an office visit. Pt was warned at last refill she must have an OV.  Can we please call and schedule pt for an OV with Dr. Darrick Huntsman. Thank you.

## 2016-02-27 ENCOUNTER — Ambulatory Visit (INDEPENDENT_AMBULATORY_CARE_PROVIDER_SITE_OTHER): Payer: Managed Care, Other (non HMO) | Admitting: Internal Medicine

## 2016-02-27 ENCOUNTER — Encounter: Payer: Self-pay | Admitting: Internal Medicine

## 2016-02-27 VITALS — BP 138/96 | HR 67 | Temp 97.6°F | Resp 12 | Ht 66.0 in | Wt 209.8 lb

## 2016-02-27 DIAGNOSIS — E669 Obesity, unspecified: Secondary | ICD-10-CM

## 2016-02-27 DIAGNOSIS — E782 Mixed hyperlipidemia: Secondary | ICD-10-CM | POA: Diagnosis not present

## 2016-02-27 DIAGNOSIS — E559 Vitamin D deficiency, unspecified: Secondary | ICD-10-CM | POA: Diagnosis not present

## 2016-02-27 DIAGNOSIS — Z0000 Encounter for general adult medical examination without abnormal findings: Secondary | ICD-10-CM

## 2016-02-27 DIAGNOSIS — Z1239 Encounter for other screening for malignant neoplasm of breast: Secondary | ICD-10-CM

## 2016-02-27 DIAGNOSIS — R5383 Other fatigue: Secondary | ICD-10-CM

## 2016-02-27 DIAGNOSIS — G35 Multiple sclerosis: Secondary | ICD-10-CM

## 2016-02-27 DIAGNOSIS — F411 Generalized anxiety disorder: Secondary | ICD-10-CM

## 2016-02-27 DIAGNOSIS — F5101 Primary insomnia: Secondary | ICD-10-CM

## 2016-02-27 DIAGNOSIS — Z1231 Encounter for screening mammogram for malignant neoplasm of breast: Secondary | ICD-10-CM

## 2016-02-27 DIAGNOSIS — R030 Elevated blood-pressure reading, without diagnosis of hypertension: Secondary | ICD-10-CM

## 2016-02-27 DIAGNOSIS — N912 Amenorrhea, unspecified: Secondary | ICD-10-CM

## 2016-02-27 DIAGNOSIS — R7301 Impaired fasting glucose: Secondary | ICD-10-CM

## 2016-02-27 MED ORDER — ALPRAZOLAM 0.5 MG PO TABS: 1.0000 mg | ORAL_TABLET | Freq: Two times a day (BID) | ORAL | 5 refills | Status: DC | PRN

## 2016-02-27 MED ORDER — ESCITALOPRAM OXALATE 20 MG PO TABS: 20.0000 mg | ORAL_TABLET | Freq: Every day | ORAL | 1 refills | Status: DC

## 2016-02-27 NOTE — Patient Instructions (Signed)
I am SO PROUD OF YOU for sticking with [employer]!  You look so healthy and strong!  Return for fasting labs at your leisure.  I'll include FSH and LH to confirm menopause  Natalie Pugh's 30 day recipe book has WONDERFUL WEEKDAY RECIPES   Menopause is a normal process in which your reproductive ability comes to an end. This process happens gradually over a span of months to years, usually between the ages of 37 and 7. Menopause is complete when you have missed 12 consecutive menstrual periods. It is important to talk with your health care provider about some of the most common conditions that affect postmenopausal women, such as heart disease, cancer, and bone loss (osteoporosis). Adopting a healthy lifestyle and getting preventive care can help to promote your health and wellness. Those actions can also lower your chances of developing some of these common conditions. WHAT SHOULD I KNOW ABOUT MENOPAUSE? During menopause, you may experience a number of symptoms, such as:  Moderate-to-severe hot flashes.  Night sweats.  Decrease in sex drive.  Mood swings.  Headaches.  Tiredness.  Irritability.  Memory problems.  Insomnia. Choosing to treat or not to treat menopausal changes is an individual decision that you make with your health care provider. WHAT SHOULD I KNOW ABOUT HORMONE REPLACEMENT THERAPY AND SUPPLEMENTS? Hormone therapy products are effective for treating symptoms that are associated with menopause, such as hot flashes and night sweats. Hormone replacement carries certain risks, especially as you become older. If you are thinking about using estrogen or estrogen with progestin treatments, discuss the benefits and risks with your health care provider. WHAT SHOULD I KNOW ABOUT HEART DISEASE AND STROKE? Heart disease, heart attack, and stroke become more likely as you age. This may be due, in part, to the hormonal changes that your body experiences during menopause. These can  affect how your body processes dietary fats, triglycerides, and cholesterol. Heart attack and stroke are both medical emergencies. There are many things that you can do to help prevent heart disease and stroke:  Have your blood pressure checked at least every 1-2 years. High blood pressure causes heart disease and increases the risk of stroke.  If you are 81-65 years old, ask your health care provider if you should take aspirin to prevent a heart attack or a stroke.  Do not use any tobacco products, including cigarettes, chewing tobacco, or electronic cigarettes. If you need help quitting, ask your health care provider.  It is important to eat a healthy diet and maintain a healthy weight.  Be sure to include plenty of vegetables, fruits, low-fat dairy products, and lean protein.  Avoid eating foods that are high in solid fats, added sugars, or salt (sodium).  Get regular exercise. This is one of the most important things that you can do for your health.  Try to exercise for at least 150 minutes each week. The type of exercise that you do should increase your heart rate and make you sweat. This is known as moderate-intensity exercise.  Try to do strengthening exercises at least twice each week. Do these in addition to the moderate-intensity exercise.  Know your numbers.Ask your health care provider to check your cholesterol and your blood glucose. Continue to have your blood tested as directed by your health care provider. WHAT SHOULD I KNOW ABOUT CANCER SCREENING? There are several types of cancer. Take the following steps to reduce your risk and to catch any cancer development as early as possible. Breast Cancer  Practice breast self-awareness.  This means understanding how your breasts normally appear and feel.  It also means doing regular breast self-exams. Let your health care provider know about any changes, no matter how small.  If you are 17 or older, have a clinician do a  breast exam (clinical breast exam or CBE) every year. Depending on your age, family history, and medical history, it may be recommended that you also have a yearly breast X-ray (mammogram).  If you have a family history of breast cancer, talk with your health care provider about genetic screening.  If you are at high risk for breast cancer, talk with your health care provider about having an MRI and a mammogram every year.  Breast cancer (BRCA) gene test is recommended for women who have family members with BRCA-related cancers. Results of the assessment will determine the need for genetic counseling and BRCA1 and for BRCA2 testing. BRCA-related cancers include these types:  Breast. This occurs in males or females.  Ovarian.  Tubal. This may also be called fallopian tube cancer.  Cancer of the abdominal or pelvic lining (peritoneal cancer).  Prostate.  Pancreatic. Cervical, Uterine, and Ovarian Cancer Your health care provider may recommend that you be screened regularly for cancer of the pelvic organs. These include your ovaries, uterus, and vagina. This screening involves a pelvic exam, which includes checking for microscopic changes to the surface of your cervix (Pap test).  For women ages 21-65, health care providers may recommend a pelvic exam and a Pap test every three years. For women ages 81-65, they may recommend the Pap test and pelvic exam, combined with testing for human papilloma virus (HPV), every five years. Some types of HPV increase your risk of cervical cancer. Testing for HPV may also be done on women of any age who have unclear Pap test results.  Other health care providers may not recommend any screening for nonpregnant women who are considered low risk for pelvic cancer and have no symptoms. Ask your health care provider if a screening pelvic exam is right for you.  If you have had past treatment for cervical cancer or a condition that could lead to cancer, you need  Pap tests and screening for cancer for at least 20 years after your treatment. If Pap tests have been discontinued for you, your risk factors (such as having a new sexual partner) need to be reassessed to determine if you should start having screenings again. Some women have medical problems that increase the chance of getting cervical cancer. In these cases, your health care provider may recommend that you have screening and Pap tests more often.  If you have a family history of uterine cancer or ovarian cancer, talk with your health care provider about genetic screening.  If you have vaginal bleeding after reaching menopause, tell your health care provider.  There are currently no reliable tests available to screen for ovarian cancer. Lung Cancer Lung cancer screening is recommended for adults 9-68 years old who are at high risk for lung cancer because of a history of smoking. A yearly low-dose CT scan of the lungs is recommended if you:  Currently smoke.  Have a history of at least 30 pack-years of smoking and you currently smoke or have quit within the past 15 years. A pack-year is smoking an average of one pack of cigarettes per day for one year. Yearly screening should:  Continue until it has been 15 years since you quit.  Stop if you  develop a health problem that would prevent you from having lung cancer treatment. Colorectal Cancer  This type of cancer can be detected and can often be prevented.  Routine colorectal cancer screening usually begins at age 59 and continues through age 88.  If you have risk factors for colon cancer, your health care provider may recommend that you be screened at an earlier age.  If you have a family history of colorectal cancer, talk with your health care provider about genetic screening.  Your health care provider may also recommend using home test kits to check for hidden blood in your stool.  A small camera at the end of a tube can be used to  examine your colon directly (sigmoidoscopy or colonoscopy). This is done to check for the earliest forms of colorectal cancer.  Direct examination of the colon should be repeated every 5-10 years until age 25. However, if early forms of precancerous polyps or small growths are found or if you have a family history or genetic risk for colorectal cancer, you may need to be screened more often. Skin Cancer  Check your skin from head to toe regularly.  Monitor any moles. Be sure to tell your health care provider:  About any new moles or changes in moles, especially if there is a change in a mole's shape or color.  If you have a mole that is larger than the size of a pencil eraser.  If any of your family members has a history of skin cancer, especially at a young age, talk with your health care provider about genetic screening.  Always use sunscreen. Apply sunscreen liberally and repeatedly throughout the day.  Whenever you are outside, protect yourself by wearing long sleeves, pants, a wide-brimmed hat, and sunglasses. WHAT SHOULD I KNOW ABOUT OSTEOPOROSIS? Osteoporosis is a condition in which bone destruction happens more quickly than new bone creation. After menopause, you may be at an increased risk for osteoporosis. To help prevent osteoporosis or the bone fractures that can happen because of osteoporosis, the following is recommended:  If you are 90-10 years old, get at least 1,000 mg of calcium and at least 600 mg of vitamin D per day.  If you are older than age 19 but younger than age 50, get at least 1,200 mg of calcium and at least 600 mg of vitamin D per day.  If you are older than age 62, get at least 1,200 mg of calcium and at least 800 mg of vitamin D per day. Smoking and excessive alcohol intake increase the risk of osteoporosis. Eat foods that are rich in calcium and vitamin D, and do weight-bearing exercises several times each week as directed by your health care provider. WHAT  SHOULD I KNOW ABOUT HOW MENOPAUSE AFFECTS [place]? Depression may occur at any age, but it is more common as you become older. Common symptoms of depression include:  Low or sad mood.  Changes in sleep patterns.  Changes in appetite or eating patterns.  Feeling an overall lack of motivation or enjoyment of activities that you previously enjoyed.  Frequent crying spells. Talk with your health care provider if you think that you are experiencing depression. WHAT SHOULD I KNOW ABOUT IMMUNIZATIONS? It is important that you get and maintain your immunizations. These include:  Tetanus, diphtheria, and pertussis (Tdap) booster vaccine.  Influenza every year before the flu season begins.  Pneumonia vaccine.  Shingles vaccine. Your health care provider may also recommend other immunizations.  This information is not intended to replace advice given to you by your health care provider. Make sure you discuss any questions you have with your health care provider.   Document Released: 06/18/2005 Document Revised: 05/17/2014 Document Reviewed: 12/27/2013 Elsevier Interactive Patient Education [employer].

## 2016-02-27 NOTE — Progress Notes (Signed)
Patient ID: Natalie Pugh, female    DOB: [birth year]  Age: 50 y.o. MRN: [id number]  The patient is here for annual  wellness examination and management of other chronic and acute problems.  PAP normal  2016   The risk factors are reflected in the social history.  The roster of all physicians providing medical care to patient - is listed in the Snapshot section of the chart.  Home safety : The patient has smoke detectors in the home. They wear seatbelts.  There are no firearms at home. There is no violence in the home.   There is no risks for hepatitis, STDs or HIV. There is no   history of blood transfusion. They have no travel history to infectious disease endemic areas of the world.  The patient has seen their dentist in the last six month. They have seen their eye doctor in the last year. They admit to slight hearing difficulty with regard to whispered voices and some television programs.  They have deferred audiologic testing in the last year.  They do not  have excessive sun exposure. Discussed the need for sun protection: hats, long sleeves and use of sunscreen if there is significant sun exposure.   Diet: the importance of a healthy diet is discussed. They do have a healthy diet.  The benefits of regular aerobic exercise were discussed. She walks 4 times per week ,  20 minutes.   Depression screen: there are no signs or vegative symptoms of depression- irritability, change in appetite, anhedonia, sadness/tearfullness.   The following portions of the patient's history were reviewed and updated as appropriate: allergies, current medications, past family history, past medical history,  past surgical history, past social history  and problem list.  Visual acuity was not assessed per patient preference since she has regular follow up with her ophthalmologist. Hearing and body mass index were assessed and reviewed.   During the course of the visit the patient was educated and counseled  about appropriate screening and preventive services including : fall prevention , diabetes screening, nutrition counseling, colorectal cancer screening, and recommended immunizations.    CC: The primary encounter diagnosis was Fatigue, unspecified type. Diagnoses of Vitamin D deficiency, Mixed hyperlipidemia, Primary insomnia, Multiple sclerosis (HCC), Amenorrhea, Breast cancer screening, Generalized anxiety disorder, Encounter for preventive health examination, Impaired fasting glucose, White coat syndrome without diagnosis of hypertension, and Obesity (BMI 30-39.9) were also pertinent to this visit.  Intentional wt loss of 24 lbs in the past year using weight watchers .  Broke her goal of 210  Gained weight 5 lbs on [employer] diet  GAD:  Has resumed lexapro 10 mg since February,  Wants to  increase dose to 20 mg still feels "blah " and overwhelmed  Regularly.  ,  Using alprazolam as needed.for panic attacks brought on by  social anxiety and MS issues.   Not using it  nightly in reduced dose to 1/2 or 1/4 for early wakeups   Not drinking wine anymore.   Takes tylenol PM with Rebif  .  No progression of MS or relapse.  Due for neurology follow up   Using the recumbent bike   Had the flu vaccine  recently    History Natalie Pugh has a past medical history of Anxiety; Diabetes mellitus; Hypertension; Multiple sclerosis, relapsing-remitting (HCC) (2001); Neurogenic bladder; and SVT (supraventricular tachycardia) (HCC).   She has a past surgical history that includes Colonoscopy with propofol (N/A, 01/29/2015).   Her family history includes  Breast cancer (age of onset: 58) in her maternal aunt; COPD in her mother; Cancer (age of onset: 60) in her maternal aunt; Heart disease in her mother; Hyperlipidemia in her paternal uncle.She reports that she quit smoking about 22 years ago. Her smoking use included Cigarettes. She has never used smokeless tobacco. She reports that she drinks alcohol. She reports  that she does not use drugs.  Outpatient Medications Prior to Visit  Medication Sig Dispense Refill  . Cholecalciferol (VITAMIN D3) 1000 UNITS CAPS Take 2 capsules by mouth daily.     . metoprolol succinate (TOPROL-XL) 50 MG 24 hr tablet TAKE 1 TABLET (50 MG TOTAL) BY MOUTH DAILY. 30 tablet 0  . Multiple Vitamin (MULTIVITAMIN) tablet Take 1 tablet by mouth daily.    Marland Kitchen oxybutynin (DITROPAN-XL) 5 MG 24 hr tablet Take 5 mg by mouth daily as needed.     . REBIF 44 MCG/0.5ML injection INJECT 1 PREFILLED SYRINGE SUBCUTANEOUSLY 3 TIMES PER WEEK AS DIRECTED 18 mL 0  . ALPRAZolam (XANAX) 1 MG tablet Take 1 tablet (1 mg total) by mouth 2 (two) times daily as needed for anxiety or sleep. 60 tablet 3   No facility-administered medications prior to visit.     Review of Systems   Patient denies headache, fevers, malaise, unintentional weight loss, skin rash, eye pain, sinus congestion and sinus pain, sore throat, dysphagia,  hemoptysis , cough, dyspnea, wheezing, chest pain, palpitations, orthopnea, edema, abdominal pain, nausea, melena, diarrhea, constipation, flank pain, dysuria, hematuria, urinary  Frequency, nocturia, numbness, tingling, seizures,  Focal weakness, Loss of consciousness,  Tremor, insomnia, depression, anxiety, and suicidal ideation.     Objective:  BP (!) 138/96   Pulse 67   Temp 97.6 F (36.4 C) (Oral)   Resp 12   Ht 5\' 6"  (1.676 m)   Wt 209 lb 12 oz (95.1 kg)   SpO2 100%   BMI 33.85 kg/m   Physical Exam   General appearance: alert, cooperative and appears stated age Head: Normocephalic, without obvious abnormality, atraumatic Eyes: conjunctivae/corneas clear. PERRL, EOM's intact. Fundi benign. Ears: normal TM's and external ear canals both ears Nose: Nares normal. Septum midline. Mucosa normal. No drainage or sinus tenderness. Throat: lips, mucosa, and tongue normal; teeth and gums normal Neck: no adenopathy, no carotid bruit, no JVD, supple, symmetrical, trachea  midline and thyroid not enlarged, symmetric, no tenderness/mass/nodules Lungs: clear to auscultation bilaterally Breasts: normal appearance, no masses or tenderness Heart: regular rate and rhythm, S1, S2 normal, no murmur, click, rub or gallop Abdomen: soft, non-tender; bowel sounds normal; no masses,  no organomegaly Extremities: extremities normal, atraumatic, no cyanosis or edema Pulses: 2+ and symmetric Skin: Skin color, texture, turgor normal. No rashes or lesions Neurologic: Alert and oriented X 3, normal strength and tone. Normal symmetric reflexes. Normal coordination and gait.     Assessment & Plan:   Problem List Items Addressed This Visit    White coat syndrome without diagnosis of hypertension    She has no history of hypertension but has had several elevated readings.  She has been asked to check her pressures at home and submit readings for evaluation. Renal function will be checked       Obesity (BMI 30-39.9)    I have congratulated her in reduction of   BMI and encouraged  Continued weight loss with goal of 10% of body weigh over the next 6 months using a low glycemic index diet and regular exercise a minimum of 5 days per  week.        Impaired fasting glucose    LAST YEAR'S A1C WAS 5.9, AND SHE HAS HAD AN INTENTIONAL WEIGHT LOSS of 24 lbs.  Will repeat a1c with fasting labs.        Relevant Orders   Hemoglobin A1c   Generalized anxiety disorder    Recurrent,  With infrequent panic attacks brought on by social anxiety.       Encounter for preventive health examination    Annual comprehensive preventive exam was done as well as an evaluation and management of chronic conditions .  During the course of the visit the patient was educated and counseled about appropriate screening and preventive services including :  diabetes screening, lipid analysis with projected  10 year  risk for CAD , nutrition counseling, breast, cervical and colorectal cancer screening, and  recommended immunizations.  Printed recommendations for health maintenance screenings was given.       Other Visit Diagnoses    Fatigue, unspecified type    -  Primary   Relevant Orders   Comprehensive metabolic panel   CBC with Differential/Platelet   TSH   Vitamin D deficiency       Relevant Orders   VITAMIN D 25 Hydroxy (Vit-D Deficiency, Fractures)   Mixed hyperlipidemia       Relevant Orders   Lipid panel   Primary insomnia       Relevant Medications   ALPRAZolam (XANAX) 0.5 MG tablet   Multiple sclerosis (HCC)       Relevant Orders   Ambulatory referral to Ophthalmology   Amenorrhea       Relevant Orders   Follicle stimulating hormone   LH   Breast cancer screening       Relevant Orders   MM SCREENING BREAST TOMO BILATERAL      I have changed Ms. Danek's ALPRAZolam and escitalopram. I am also having her maintain her oxybutynin, Vitamin D3, multivitamin, REBIF, metoprolol succinate, and Cranberry.  Meds ordered this encounter  Medications  . DISCONTD: escitalopram (LEXAPRO) 20 MG tablet    Sig: Take 10 mg by mouth daily.  . Cranberry 250 MG CAPS    Sig: Take 2 capsules by mouth daily.  Marland Kitchen ALPRAZolam (XANAX) 0.5 MG tablet    Sig: Take 2 tablets (1 mg total) by mouth 2 (two) times daily as needed for anxiety or sleep.    Dispense:  60 tablet    Refill:  5  . escitalopram (LEXAPRO) 20 MG tablet    Sig: Take 1 tablet (20 mg total) by mouth daily.    Dispense:  90 tablet    Refill:  1    Medications Discontinued During This Encounter  Medication Reason  . ALPRAZolam (XANAX) 1 MG tablet Reorder  . escitalopram (LEXAPRO) 20 MG tablet Reorder    Follow-up: No Follow-up on file.   Sherlene Shams, MD

## 2016-02-29 NOTE — Assessment & Plan Note (Signed)
LAST YEAR'S A1C WAS 5.9, AND SHE HAS HAD AN INTENTIONAL WEIGHT LOSS of 24 lbs.  Will repeat a1c with fasting labs.

## 2016-02-29 NOTE — Assessment & Plan Note (Signed)
She has no history of hypertension but has had several elevated readings.  She has been asked to check her pressures at home and submit readings for evaluation. Renal function will be checked  

## 2016-02-29 NOTE — Assessment & Plan Note (Signed)
I have congratulated her in reduction of   BMI and encouraged  Continued weight loss with goal of 10% of body weigh over the next 6 months using a low glycemic index diet and regular exercise a minimum of 5 days per week.    

## 2016-02-29 NOTE — Assessment & Plan Note (Signed)
Annual comprehensive preventive exam was done as well as an evaluation and management of chronic conditions .  During the course of the visit the patient was educated and counseled about appropriate screening and preventive services including :  diabetes screening, lipid analysis with projected  10 year  risk for CAD , nutrition counseling, breast, cervical and colorectal cancer screening, and recommended immunizations.  Printed recommendations for health maintenance screenings was given 

## 2016-02-29 NOTE — Assessment & Plan Note (Signed)
Recurrent,  With infrequent panic attacks brought on by social anxiety.

## 2016-03-03 ENCOUNTER — Other Ambulatory Visit (INDEPENDENT_AMBULATORY_CARE_PROVIDER_SITE_OTHER): Payer: Managed Care, Other (non HMO)

## 2016-03-03 DIAGNOSIS — R7301 Impaired fasting glucose: Secondary | ICD-10-CM | POA: Diagnosis not present

## 2016-03-03 DIAGNOSIS — E559 Vitamin D deficiency, unspecified: Secondary | ICD-10-CM | POA: Diagnosis not present

## 2016-03-03 DIAGNOSIS — R5383 Other fatigue: Secondary | ICD-10-CM | POA: Diagnosis not present

## 2016-03-03 DIAGNOSIS — E782 Mixed hyperlipidemia: Secondary | ICD-10-CM

## 2016-03-03 DIAGNOSIS — N912 Amenorrhea, unspecified: Secondary | ICD-10-CM | POA: Diagnosis not present

## 2016-03-03 LAB — LIPID PANEL
Cholesterol: 179 mg/dL (ref 0–200)
HDL: 54.9 mg/dL (ref >39.00)
LDL CALC: 92 mg/dL (ref 0–99)
NONHDL: 123.75
Total CHOL/HDL Ratio: 3
Triglycerides: 159 mg/dL — ABNORMAL HIGH (ref 0.0–149.0)
VLDL: 31.8 mg/dL (ref 0.0–40.0)

## 2016-03-03 LAB — COMPREHENSIVE METABOLIC PANEL
ALT: 20 U/L (ref 0–35)
AST: 23 U/L (ref 0–37)
Albumin: 4.2 g/dL (ref 3.5–5.2)
Alkaline Phosphatase: 56 U/L (ref 39–117)
BUN: 17 mg/dL (ref 6–23)
CALCIUM: 9.4 mg/dL (ref 8.4–10.5)
CHLORIDE: 99 meq/L (ref 96–112)
CO2: 28 meq/L (ref 19–32)
CREATININE: 0.8 mg/dL (ref 0.40–1.20)
GFR: 80.02 mL/min (ref >60.00)
Glucose, Bld: 87 mg/dL (ref 70–99)
Potassium: 4.3 mEq/L (ref 3.5–5.1)
Sodium: 135 mEq/L (ref 135–145)
Total Bilirubin: 0.4 mg/dL (ref 0.2–1.2)
Total Protein: 7.4 g/dL (ref 6.0–8.3)

## 2016-03-03 LAB — CBC WITH DIFFERENTIAL/PLATELET
BASOS PCT: 0.5 % (ref 0.0–3.0)
Basophils Absolute: 0 10*3/uL (ref 0.0–0.1)
EOS PCT: 1.5 % (ref 0.0–5.0)
Eosinophils Absolute: 0.1 10*3/uL (ref 0.0–0.7)
HEMATOCRIT: 39.1 % (ref 36.0–46.0)
HEMOGLOBIN: 13.1 g/dL (ref 12.0–15.0)
LYMPHS PCT: 27.6 % (ref 12.0–46.0)
Lymphs Abs: 1.6 10*3/uL (ref 0.7–4.0)
MCHC: 33.6 g/dL (ref 30.0–36.0)
MCV: 93 fl (ref 78.0–100.0)
Monocytes Absolute: 0.6 10*3/uL (ref 0.1–1.0)
Monocytes Relative: 10.5 % (ref 3.0–12.0)
Neutro Abs: 3.5 10*3/uL (ref 1.4–7.7)
Neutrophils Relative %: 59.9 % (ref 43.0–77.0)
Platelets: 282 10*3/uL (ref 150.0–400.0)
RBC: 4.2 Mil/uL (ref 3.87–5.11)
RDW: 12.6 % (ref 11.5–15.5)
WBC: 5.8 10*3/uL (ref 4.0–10.5)

## 2016-03-03 LAB — HEMOGLOBIN A1C: HEMOGLOBIN A1C: 5.9 % (ref 4.6–6.5)

## 2016-03-03 LAB — TSH: TSH: 1.18 u[IU]/mL (ref 0.35–4.50)

## 2016-03-03 LAB — VITAMIN D 25 HYDROXY (VIT D DEFICIENCY, FRACTURES): VITD: 36.29 ng/mL (ref 30.00–100.00)

## 2016-03-03 LAB — LUTEINIZING HORMONE: LH: 44.64 m[IU]/mL

## 2016-03-03 LAB — FOLLICLE STIMULATING HORMONE: FSH: 131.5 m[IU]/mL

## 2016-03-05 ENCOUNTER — Ambulatory Visit: Admission: RE | Admit: 2016-03-05 | Payer: Managed Care, Other (non HMO) | Source: Ambulatory Visit

## 2016-04-12 ENCOUNTER — Other Ambulatory Visit: Payer: Self-pay | Admitting: Internal Medicine

## 2016-04-20 ENCOUNTER — Ambulatory Visit: Payer: Managed Care, Other (non HMO)

## 2016-06-03 ENCOUNTER — Encounter: Payer: Self-pay | Admitting: Radiology

## 2016-06-03 ENCOUNTER — Ambulatory Visit
Admission: RE | Admit: 2016-06-03 | Discharge: 2016-06-03 | Disposition: A | Discharge status: Home / Self Care | Payer: Managed Care, Other (non HMO) | Source: Ambulatory Visit | Attending: Internal Medicine | Admitting: Internal Medicine

## 2016-06-03 DIAGNOSIS — Z1231 Encounter for screening mammogram for malignant neoplasm of breast: Secondary | ICD-10-CM | POA: Diagnosis present

## 2016-06-03 DIAGNOSIS — Z1239 Encounter for other screening for malignant neoplasm of breast: Secondary | ICD-10-CM

## 2016-06-09 ENCOUNTER — Telehealth: Payer: Self-pay | Admitting: Internal Medicine

## 2016-06-09 NOTE — Telephone Encounter (Signed)
Pt called and wanted to know if Dr. Darrick Huntsman would lower the dosage of her escitalopram (LEXAPRO) 20 MG tablet. Pt states that she feels better when she take 10 mg. Please advise, thank you!  Call pt @ [phone]

## 2016-06-10 MED ORDER — ESCITALOPRAM OXALATE 10 MG PO TABS: 10.0000 mg | ORAL_TABLET | Freq: Every day | ORAL | 0 refills | Status: DC

## 2016-06-10 NOTE — Telephone Encounter (Signed)
Dose reduced to 10 mg ,  90 day supply sent.  Needs 6 month follow up in April.

## 2016-06-10 NOTE — Telephone Encounter (Signed)
Last OV with you was 02/27/16. No existing appts. Please advise.

## 2016-06-10 NOTE — Telephone Encounter (Signed)
Left detailed mess informing pt Rx sent and to call us back to schedule f/u in April 2018.

## 2016-09-15 ENCOUNTER — Other Ambulatory Visit: Payer: Self-pay | Admitting: Internal Medicine

## 2016-11-24 ENCOUNTER — Encounter: Payer: Self-pay | Admitting: Internal Medicine

## 2016-11-24 ENCOUNTER — Ambulatory Visit (INDEPENDENT_AMBULATORY_CARE_PROVIDER_SITE_OTHER): Payer: Managed Care, Other (non HMO) | Admitting: Internal Medicine

## 2016-11-24 VITALS — BP 138/94 | HR 86 | Temp 97.9°F | Resp 17 | Ht 66.0 in | Wt 217.0 lb

## 2016-11-24 DIAGNOSIS — F411 Generalized anxiety disorder: Secondary | ICD-10-CM | POA: Diagnosis not present

## 2016-11-24 DIAGNOSIS — F5101 Primary insomnia: Secondary | ICD-10-CM

## 2016-11-24 DIAGNOSIS — R7301 Impaired fasting glucose: Secondary | ICD-10-CM

## 2016-11-24 DIAGNOSIS — R7303 Prediabetes: Secondary | ICD-10-CM

## 2016-11-24 DIAGNOSIS — E669 Obesity, unspecified: Secondary | ICD-10-CM | POA: Diagnosis not present

## 2016-11-24 DIAGNOSIS — E7800 Pure hypercholesterolemia, unspecified: Secondary | ICD-10-CM | POA: Diagnosis not present

## 2016-11-24 LAB — COMPREHENSIVE METABOLIC PANEL
ALT: 25 U/L (ref 0–35)
AST: 25 U/L (ref 0–37)
Albumin: 4.4 g/dL (ref 3.5–5.2)
Alkaline Phosphatase: 52 U/L (ref 39–117)
BILIRUBIN TOTAL: 0.5 mg/dL (ref 0.2–1.2)
BUN: 16 mg/dL (ref 6–23)
CHLORIDE: 101 meq/L (ref 96–112)
CO2: 28 meq/L (ref 19–32)
Calcium: 9.8 mg/dL (ref 8.4–10.5)
Creatinine, Ser: 0.82 mg/dL (ref 0.40–1.20)
GFR: 77.55 mL/min (ref >60.00)
GLUCOSE: 88 mg/dL (ref 70–99)
Potassium: 4.4 mEq/L (ref 3.5–5.1)
Sodium: 137 mEq/L (ref 135–145)
Total Protein: 7.7 g/dL (ref 6.0–8.3)

## 2016-11-24 LAB — LIPID PANEL
CHOL/HDL RATIO: 3
Cholesterol: 186 mg/dL (ref 0–200)
HDL: 55 mg/dL (ref >39.00)
LDL CALC: 109 mg/dL — AB (ref 0–99)
NONHDL: 130.53
Triglycerides: 107 mg/dL (ref 0.0–149.0)
VLDL: 21.4 mg/dL (ref 0.0–40.0)

## 2016-11-24 LAB — HEMOGLOBIN A1C: Hgb A1c MFr Bld: 6.1 % (ref 4.6–6.5)

## 2016-11-24 MED ORDER — ALPRAZOLAM 0.5 MG PO TABS: 1.0000 mg | ORAL_TABLET | Freq: Two times a day (BID) | ORAL | 5 refills | Status: DC | PRN

## 2016-11-24 MED ORDER — METOPROLOL SUCCINATE ER 50 MG PO TB24: 50.0000 mg | ORAL_TABLET | Freq: Every day | ORAL | 1 refills | Status: DC

## 2016-11-24 MED ORDER — ESCITALOPRAM OXALATE 10 MG PO TABS: 10.0000 mg | ORAL_TABLET | Freq: Every day | ORAL | 1 refills | Status: DC

## 2016-11-24 NOTE — Patient Instructions (Signed)
You might want to try a premixed protein drink called Premier Protein shake for breakfast or late night snack . It is great tasting,   very low sugar and available of < $2 serving at [facility] and  In bulk for $1.50/serving at [employer] and [employer]  .      To make a low carb chip :  Take the Joseph's Lavash or Pita bread,  Or the [place] Low carb whole wheat tortilla   Place on metal cookie sheet  Brush with olive oil  Sprinkle garlic powder (NOT garlic salt), grated parmesan cheese, mediterranean seasoning , or all of them?  Bake at 275 for 30 minutes   We have substitutions for your potatoes!!  Try the mashed cauliflower and riced cauliflower dishes instead of rice and mashed potatoes  Mashed turnips are also very low carb!   For desserts :  Try the Dannon [employer] Fit greek yogurt dessert flavors and top with reddi Whip .  8 carbs,  80 calories  Try Oikos Triple Zero [place] Yogurt in the salted caramel, and the coffee flavors  With Whipped Cream for dessert  breyer's low carb ice cream, available in bars (on a stick, better ) or scoopable ice cream  HERE ARE THE LOW CARB  BREAD CHOICES

## 2016-11-24 NOTE — Progress Notes (Signed)
Subjective:  Patient ID: Natalie Pugh, female    DOB: Dec 26, 1965  Age: 51 y.o. MRN: [id number]  CC: The primary encounter diagnosis was Prediabetes. Diagnoses of Pure hypercholesterolemia, Primary insomnia, Impaired fasting glucose, Obesity (BMI 30-39.9), and Generalized anxiety disorder were also pertinent to this visit.  HPI Natalie [place]ugh [place] presents for follow up hypertension,  GAD ,  IPG, obesity and MS  Obesity: started KETO 10 days ago , 10 lb weight loss already noted by home scales.  Riding a stationery bike 5-6 miles daily but finds that it really wears her out and she is excessivley fatigued for the rest of the day   Patient is taking her medications as prescribed and notes no adverse effects.  Home BP readings have been done about once per week and are  generally < 130/80 .  She is avoiding added salt in her diet  .   Outpatient Medications Prior to Visit  Medication Sig Dispense Refill  . Cholecalciferol (VITAMIN D3) 1000 UNITS CAPS Take 2 capsules by mouth daily.     . Cranberry 250 MG CAPS Take 2 capsules by mouth daily.    . Multiple Vitamin (MULTIVITAMIN) tablet Take 1 tablet by mouth daily.    Marland Kitchen oxybutynin (DITROPAN-XL) 5 MG 24 hr tablet Take 5 mg by mouth daily as needed.     . REBIF 44 MCG/0.5ML injection INJECT 1 PREFILLED SYRINGE SUBCUTANEOUSLY 3 TIMES PER WEEK AS DIRECTED 18 mL 0  . ALPRAZolam (XANAX) 0.5 MG tablet Take 2 tablets (1 mg total) by mouth 2 (two) times daily as needed for anxiety or sleep. 60 tablet 5  . escitalopram (LEXAPRO) 10 MG tablet Take 1 tablet (10 mg total) by mouth daily. 90 tablet 0  . metoprolol succinate (TOPROL-XL) 50 MG 24 hr tablet Take 1 tablet (50 mg total) by mouth daily. 30 tablet 5   No facility-administered medications prior to visit.     Review of Systems;  Patient denies headache, fevers, malaise, unintentional weight loss, skin rash, eye pain, sinus congestion and sinus pain, sore throat, dysphagia,  hemoptysis ,  cough, dyspnea, wheezing, chest pain, palpitations, orthopnea, edema, abdominal pain, nausea, melena, diarrhea, constipation, flank pain, dysuria, hematuria, urinary  Frequency, nocturia, numbness, tingling, seizures,  Focal weakness, Loss of consciousness,  Tremor, insomnia, depression, anxiety, and suicidal ideation.      Objective:  BP (!) 138/94 (BP Location: Left Arm, Patient Position: Sitting, Cuff Size: Large)   Pulse 86   Temp 97.9 F (36.6 C) (Oral)   Resp 17   Ht 5\' 6"  (1.676 m)   Wt 217 lb (98.4 kg)   LMP 09/28/2014 (Exact Date)   SpO2 96%   BMI 35.02 kg/m   BP Readings from Last 3 Encounters:  11/24/16 (!) 138/94  02/27/16 (!) 138/96  01/29/15 120/88    Wt Readings from Last 3 Encounters:  11/24/16 217 lb (98.4 kg)  02/27/16 209 lb 12 oz (95.1 kg)  01/29/15 229 lb (103.9 kg)    General appearance: alert, cooperative and appears stated age Ears: normal TM's and external ear canals both ears Throat: lips, mucosa, and tongue normal; teeth and gums normal Neck: no adenopathy, no carotid bruit, supple, symmetrical, trachea midline and thyroid not enlarged, symmetric, no tenderness/mass/nodules Back: symmetric, no curvature. ROM normal. No CVA tenderness. Lungs: clear to auscultation bilaterally Heart: regular rate and rhythm, S1, S2 normal, no murmur, click, rub or gallop Abdomen: soft, non-tender; bowel sounds normal; no masses,  no organomegaly Pulses:  2+ and symmetric Skin: Skin color, texture, turgor normal. No rashes or lesions Lymph nodes: Cervical, supraclavicular, and axillary nodes normal.  Lab Results  Component Value Date   HGBA1C 6.1 11/24/2016   HGBA1C 5.9 03/03/2016   HGBA1C 5.9 11/21/2014    Lab Results  Component Value Date   CREATININE 0.82 11/24/2016   CREATININE 0.80 03/03/2016   CREATININE 0.87 11/21/2014    Lab Results  Component Value Date   WBC 5.8 03/03/2016   HGB 13.1 03/03/2016   HCT 39.1 03/03/2016   PLT 282.0 03/03/2016    GLUCOSE 88 11/24/2016   CHOL 186 11/24/2016   TRIG 107.0 11/24/2016   HDL 55.00 11/24/2016   LDLDIRECT 105.8 02/21/2013   LDLCALC 109 (H) 11/24/2016   ALT 25 11/24/2016   AST 25 11/24/2016   NA 137 11/24/2016   K 4.4 11/24/2016   CL 101 11/24/2016   CREATININE 0.82 11/24/2016   BUN 16 11/24/2016   CO2 28 11/24/2016   TSH 1.18 03/03/2016   HGBA1C 6.1 11/24/2016   MICROALBUR 0.3 10/25/2013    Mm Screening Breast Tomo Bilateral  Result Date: 06/03/2016 CLINICAL DATA:  Screening. EXAM: 2D DIGITAL SCREENING BILATERAL MAMMOGRAM WITH CAD AND ADJUNCT TOMO COMPARISON:  Previous exam(s). ACR Breast Density Category b: There are scattered areas of fibroglandular density. FINDINGS: There are no findings suspicious for malignancy. Images were processed with CAD. IMPRESSION: No mammographic evidence of malignancy. A result letter of this screening mammogram will be mailed directly to the patient. RECOMMENDATION: Screening mammogram in one year. (Code:SM-B-01Y) BI-RADS CATEGORY  1: Negative. Electronically Signed   By: Ted Mcalpine M.D.   On: 06/03/2016 18:44    Assessment & Plan:   Problem List Items Addressed This Visit    Generalized anxiety disorder    Recurrent,  With infrequent panic attacks brought on by social anxiety. Continue lexapro and prn alprazolam. The risks and benefits of benzodiazepine use were reviewed with patient today including excessive sedation leading to respiratory depression,  impaired thinking/driving, and addiction.  Patient was advised to avoid concurrent use with alcohol, to use medication only as needed and not to share with others  .       Impaired fasting glucose    Her  random glucose is not  elevated but her A1c suggests she is at increased risk for developing diabetes.  I recommend she follow a low glycemic index diet and particpate regularly in an aerobic  exercise activity.  We should check an A1c in 6 months.        Obesity (BMI 30-39.9)    I have  congratulated her in  Her efforts and reviewed her diet and exercise plans at length .  A total of 40 minutes of face to face time was spent with patient more than half of which was spent in counselling on diet and exercise        Other Visit Diagnoses    Prediabetes    -  Primary   Relevant Orders   Hemoglobin A1c (Completed)   Comprehensive metabolic panel (Completed)   Pure hypercholesterolemia       Relevant Medications   metoprolol succinate (TOPROL-XL) 50 MG 24 hr tablet   Other Relevant Orders   Lipid panel (Completed)   Primary insomnia       Relevant Medications   ALPRAZolam (XANAX) 0.5 MG tablet      I am having Ms. Gerald maintain her oxybutynin, Vitamin D3, multivitamin, REBIF, Cranberry, Magnesium, ALPRAZolam, escitalopram, and  metoprolol succinate.  Meds ordered this encounter  Medications  . Magnesium 200 MG TABS    Sig: Take 1 tablet by mouth daily.  Marland Kitchen ALPRAZolam (XANAX) 0.5 MG tablet    Sig: Take 2 tablets (1 mg total) by mouth 2 (two) times daily as needed for anxiety or sleep.    Dispense:  60 tablet    Refill:  5  . escitalopram (LEXAPRO) 10 MG tablet    Sig: Take 1 tablet (10 mg total) by mouth daily.    Dispense:  90 tablet    Refill:  1  . metoprolol succinate (TOPROL-XL) 50 MG 24 hr tablet    Sig: Take 1 tablet (50 mg total) by mouth daily.    Dispense:  90 tablet    Refill:  1    Medications Discontinued During This Encounter  Medication Reason  . ALPRAZolam (XANAX) 0.5 MG tablet Reorder  . escitalopram (LEXAPRO) 10 MG tablet Reorder  . metoprolol succinate (TOPROL-XL) 50 MG 24 hr tablet Reorder    Follow-up: Return in about 6 months (around 05/27/2017) for CPE.   Sherlene Shams, MD

## 2016-11-27 ENCOUNTER — Other Ambulatory Visit: Payer: Self-pay | Admitting: Internal Medicine

## 2016-11-27 ENCOUNTER — Encounter: Payer: Self-pay | Admitting: Internal Medicine

## 2016-11-27 NOTE — Assessment & Plan Note (Signed)
Recurrent,  With infrequent panic attacks brought on by social anxiety. Continue lexapro and prn alprazolam. The risks and benefits of benzodiazepine use were reviewed with patient today including excessive sedation leading to respiratory depression,  impaired thinking/driving, and addiction.  Patient was advised to avoid concurrent use with alcohol, to use medication only as needed and not to share with others  .

## 2016-11-27 NOTE — Assessment & Plan Note (Signed)
I have congratulated her in  Her efforts and reviewed her diet and exercise plans at length .  A total of 40 minutes of face to face time was spent with patient more than half of which was spent in counselling on diet and exercise

## 2016-11-27 NOTE — Assessment & Plan Note (Signed)
Her  random glucose is not  elevated but her A1c suggests she is at increased risk for developing diabetes.  I recommend she follow a low glycemic index diet and particpate regularly in an aerobic  exercise activity.  We should check an A1c in 6 months.

## 2016-12-22 ENCOUNTER — Other Ambulatory Visit: Payer: Self-pay | Admitting: Neurology

## 2016-12-22 DIAGNOSIS — G35 Multiple sclerosis: Secondary | ICD-10-CM

## 2016-12-29 ENCOUNTER — Ambulatory Visit: Payer: Managed Care, Other (non HMO)

## 2017-01-03 ENCOUNTER — Telehealth: Payer: Self-pay | Admitting: *Deleted

## 2017-01-03 DIAGNOSIS — E785 Hyperlipidemia, unspecified: Secondary | ICD-10-CM

## 2017-01-03 DIAGNOSIS — R7301 Impaired fasting glucose: Secondary | ICD-10-CM

## 2017-01-03 DIAGNOSIS — R5383 Other fatigue: Secondary | ICD-10-CM

## 2017-01-03 NOTE — Telephone Encounter (Signed)
Pt requested a lab orders to be placed prior to her appt in November  Pt contact [phone]

## 2017-01-04 ENCOUNTER — Ambulatory Visit
Admission: RE | Admit: 2017-01-04 | Discharge: 2017-01-04 | Disposition: A | Discharge status: Home / Self Care | Payer: Managed Care, Other (non HMO) | Source: Ambulatory Visit | Attending: Neurology | Admitting: Neurology

## 2017-01-04 DIAGNOSIS — M4802 Spinal stenosis, cervical region: Secondary | ICD-10-CM | POA: Diagnosis not present

## 2017-01-04 DIAGNOSIS — G35 Multiple sclerosis: Secondary | ICD-10-CM | POA: Insufficient documentation

## 2017-01-04 MED ORDER — GADOBENATE DIMEGLUMINE 529 MG/ML IV SOLN
20.0000 mL | Freq: Once | INTRAVENOUS | Status: AC | PRN
  Administered 2017-01-04: 20 mL via INTRAVENOUS

## 2017-01-04 NOTE — Addendum Note (Signed)
Addended by: Sandy Salaam on: 01/04/2017 10:13 AM   Modules accepted: Orders

## 2017-01-04 NOTE — Telephone Encounter (Signed)
Spoke with pt and scheduled her a lab appt. Pt is aware of appt date and time.  

## 2017-01-04 NOTE — Telephone Encounter (Signed)
LMTCB. Need to schedule pt a lab appt a few days before her appt in November.   Labs have been ordered.

## 2017-03-25 ENCOUNTER — Other Ambulatory Visit (INDEPENDENT_AMBULATORY_CARE_PROVIDER_SITE_OTHER): Payer: Managed Care, Other (non HMO)

## 2017-03-25 DIAGNOSIS — R5383 Other fatigue: Secondary | ICD-10-CM | POA: Diagnosis not present

## 2017-03-25 DIAGNOSIS — R7301 Impaired fasting glucose: Secondary | ICD-10-CM

## 2017-03-25 DIAGNOSIS — E785 Hyperlipidemia, unspecified: Secondary | ICD-10-CM

## 2017-03-25 LAB — LIPID PANEL
CHOLESTEROL: 196 mg/dL (ref 0–200)
HDL: 60 mg/dL (ref >39.00)
LDL Cholesterol: 115 mg/dL — ABNORMAL HIGH (ref 0–99)
NonHDL: 136.35
Total CHOL/HDL Ratio: 3
Triglycerides: 106 mg/dL (ref 0.0–149.0)
VLDL: 21.2 mg/dL (ref 0.0–40.0)

## 2017-03-25 LAB — LDL CHOLESTEROL, DIRECT: Direct LDL: 107 mg/dL

## 2017-03-25 LAB — CBC WITH DIFFERENTIAL/PLATELET
BASOS PCT: 0.8 % (ref 0.0–3.0)
Basophils Absolute: 0 10*3/uL (ref 0.0–0.1)
EOS PCT: 2.2 % (ref 0.0–5.0)
Eosinophils Absolute: 0.1 10*3/uL (ref 0.0–0.7)
HEMATOCRIT: 37.7 % (ref 36.0–46.0)
HEMOGLOBIN: 12.4 g/dL (ref 12.0–15.0)
LYMPHS PCT: 23.8 % (ref 12.0–46.0)
Lymphs Abs: 0.9 10*3/uL (ref 0.7–4.0)
MCHC: 33 g/dL (ref 30.0–36.0)
MCV: 96.9 fl (ref 78.0–100.0)
MONO ABS: 0.5 10*3/uL (ref 0.1–1.0)
Monocytes Relative: 13.6 % — ABNORMAL HIGH (ref 3.0–12.0)
Neutro Abs: 2.2 10*3/uL (ref 1.4–7.7)
Neutrophils Relative %: 59.6 % (ref 43.0–77.0)
Platelets: 227 10*3/uL (ref 150.0–400.0)
RBC: 3.89 Mil/uL (ref 3.87–5.11)
RDW: 13 % (ref 11.5–15.5)
WBC: 3.7 10*3/uL — AB (ref 4.0–10.5)

## 2017-03-25 LAB — COMPREHENSIVE METABOLIC PANEL
ALBUMIN: 3.9 g/dL (ref 3.5–5.2)
ALK PHOS: 42 U/L (ref 39–117)
ALT: 29 U/L (ref 0–35)
AST: 27 U/L (ref 0–37)
BILIRUBIN TOTAL: 0.5 mg/dL (ref 0.2–1.2)
BUN: 17 mg/dL (ref 6–23)
CALCIUM: 9.2 mg/dL (ref 8.4–10.5)
CO2: 28 mEq/L (ref 19–32)
Chloride: 103 mEq/L (ref 96–112)
Creatinine, Ser: 0.67 mg/dL (ref 0.40–1.20)
GFR: 97.79 mL/min (ref >60.00)
Glucose, Bld: 95 mg/dL (ref 70–99)
Potassium: 4.2 mEq/L (ref 3.5–5.1)
Sodium: 139 mEq/L (ref 135–145)
TOTAL PROTEIN: 6.6 g/dL (ref 6.0–8.3)

## 2017-03-25 LAB — HEMOGLOBIN A1C: HEMOGLOBIN A1C: 5.8 % (ref 4.6–6.5)

## 2017-03-25 LAB — TSH: TSH: 2.12 u[IU]/mL (ref 0.35–4.50)

## 2017-03-25 LAB — VITAMIN D 25 HYDROXY (VIT D DEFICIENCY, FRACTURES): VITD: 59.63 ng/mL (ref 30.00–100.00)

## 2017-03-27 ENCOUNTER — Encounter: Payer: Self-pay | Admitting: Internal Medicine

## 2017-04-07 ENCOUNTER — Encounter: Payer: Self-pay | Admitting: Internal Medicine

## 2017-04-07 ENCOUNTER — Ambulatory Visit (INDEPENDENT_AMBULATORY_CARE_PROVIDER_SITE_OTHER): Payer: Managed Care, Other (non HMO) | Admitting: Internal Medicine

## 2017-04-07 VITALS — BP 130/88 | HR 71 | Temp 97.5°F | Resp 14 | Ht 66.0 in | Wt 198.0 lb

## 2017-04-07 DIAGNOSIS — E669 Obesity, unspecified: Secondary | ICD-10-CM | POA: Diagnosis not present

## 2017-04-07 DIAGNOSIS — R030 Elevated blood-pressure reading, without diagnosis of hypertension: Secondary | ICD-10-CM

## 2017-04-07 DIAGNOSIS — K5900 Constipation, unspecified: Secondary | ICD-10-CM | POA: Diagnosis not present

## 2017-04-07 DIAGNOSIS — E785 Hyperlipidemia, unspecified: Secondary | ICD-10-CM | POA: Diagnosis not present

## 2017-04-07 DIAGNOSIS — R7301 Impaired fasting glucose: Secondary | ICD-10-CM | POA: Diagnosis not present

## 2017-04-07 DIAGNOSIS — M9981 Other biomechanical lesions of cervical region: Secondary | ICD-10-CM

## 2017-04-07 DIAGNOSIS — G35 Multiple sclerosis: Secondary | ICD-10-CM

## 2017-04-07 DIAGNOSIS — Z23 Encounter for immunization: Secondary | ICD-10-CM | POA: Diagnosis not present

## 2017-04-07 DIAGNOSIS — F411 Generalized anxiety disorder: Secondary | ICD-10-CM | POA: Diagnosis not present

## 2017-04-07 DIAGNOSIS — M4802 Spinal stenosis, cervical region: Secondary | ICD-10-CM

## 2017-04-07 MED ORDER — ESCITALOPRAM OXALATE 10 MG PO TABS: 10.0000 mg | ORAL_TABLET | Freq: Every day | ORAL | 3 refills | Status: DC

## 2017-04-07 MED ORDER — METOPROLOL SUCCINATE ER 50 MG PO TB24: 50.0000 mg | ORAL_TABLET | Freq: Every day | ORAL | 1 refills | Status: DC

## 2017-04-07 NOTE — Progress Notes (Signed)
Subjective:  Patient ID: Natalie Pugh, female    DOB: Jul 24, 1965  Age: 51 y.o. MRN: [id number]  CC: The primary encounter diagnosis was Hyperlipidemia LDL goal <130. Diagnoses of Need for immunization against influenza, Impaired fasting glucose, White coat syndrome without diagnosis of hypertension, Multiple sclerosis, relapsing-remitting (HCC), Foraminal stenosis of cervical region, Obesity (BMI 30-39.9), Generalized anxiety disorder, and Constipation, unspecified constipation type were also pertinent to this visit.  HPI [name] presents for FOLLOW UP ON prediabetes, obesity  complicated by multiple sclerosis , and elevated blood pressure.  She has  lost 19 lbs since last visit following the KETO diet.  She feels much better and has more energy than previously.   Elevated blood pressure:  Recent home readings have been normal.   Has had some change in bowel movements.  Develops fecal urgency which borders  On  incontinence when she treats her  constipation.  Takes magnesium  .  Has used   "SALT CLEANSE " twice in the last 6 months Stools are hard.   Outpatient Medications Prior to Visit  Medication Sig Dispense Refill  . ALPRAZolam (XANAX) 0.5 MG tablet Take 2 tablets (1 mg total) by mouth 2 (two) times daily as needed for anxiety or sleep. 60 tablet 5  . Cholecalciferol (VITAMIN D3) 1000 UNITS CAPS Take 2 capsules by mouth daily.     . Cranberry 250 MG CAPS Take 2 capsules by mouth daily.    . Magnesium 200 MG TABS Take 1 tablet by mouth daily.    . Multiple Vitamin (MULTIVITAMIN) tablet Take 1 tablet by mouth daily.    Marland Kitchen oxybutynin (DITROPAN-XL) 5 MG 24 hr tablet Take 5 mg by mouth daily as needed.     . REBIF 44 MCG/0.5ML injection INJECT 1 PREFILLED SYRINGE SUBCUTANEOUSLY 3 TIMES PER WEEK AS DIRECTED 18 mL 0  . escitalopram (LEXAPRO) 10 MG tablet Take 1 tablet (10 mg total) by mouth daily. 90 tablet 1  . metoprolol succinate (TOPROL-XL) 50 MG 24 hr tablet Take 1 tablet  (50 mg total) by mouth daily. 90 tablet 1   No facility-administered medications prior to visit.     Review of Systems;  Patient denies headache, fevers, malaise, unintentional weight loss, skin rash, eye pain, sinus congestion and sinus pain, sore throat, dysphagia,  hemoptysis , cough, dyspnea, wheezing, chest pain, palpitations, orthopnea, edema, abdominal pain, nausea, melena, diarrhea, constipation, flank pain, dysuria, hematuria, urinary  Frequency, nocturia, numbness, tingling, seizures,  Focal weakness, Loss of consciousness,  Tremor, insomnia, depression, anxiety, and suicidal ideation.      Objective:  BP 130/88 (BP Location: Left Arm, Patient Position: Sitting, Cuff Size: Normal)   Pulse 71   Temp (!) 97.5 F (36.4 C) (Oral)   Resp 14   Ht 5\' 6"  (1.676 m)   Wt 198 lb (89.8 kg)   LMP 09/28/2014 (Exact Date)   SpO2 98%   BMI 31.96 kg/m   BP Readings from Last 3 Encounters:  04/07/17 130/88  11/24/16 (!) 138/94  02/27/16 (!) 138/96    Wt Readings from Last 3 Encounters:  04/07/17 198 lb (89.8 kg)  11/24/16 217 lb (98.4 kg)  02/27/16 209 lb 12 oz (95.1 kg)    General appearance: alert, cooperative and appears stated age Ears: normal TM's and external ear canals both ears Throat: lips, mucosa, and tongue normal; teeth and gums normal Neck: no adenopathy, no carotid bruit, supple, symmetrical, trachea midline and thyroid not enlarged, symmetric, no tenderness/mass/nodules Back:  symmetric, no curvature. ROM normal. No CVA tenderness. Lungs: clear to auscultation bilaterally Heart: regular rate and rhythm, S1, S2 normal, no murmur, click, rub or gallop Abdomen: soft, non-tender; bowel sounds normal; no masses,  no organomegaly Pulses: 2+ and symmetric Skin: Skin color, texture, turgor normal. No rashes or lesions Lymph nodes: Cervical, supraclavicular, and axillary nodes normal.  Lab Results  Component Value Date   HGBA1C 5.8 03/25/2017   HGBA1C 6.1 11/24/2016    HGBA1C 5.9 03/03/2016    Lab Results  Component Value Date   CREATININE 0.67 03/25/2017   CREATININE 0.82 11/24/2016   CREATININE 0.80 03/03/2016    Lab Results  Component Value Date   WBC 3.7 (L) 03/25/2017   HGB 12.4 03/25/2017   HCT 37.7 03/25/2017   PLT 227.0 03/25/2017   GLUCOSE 95 03/25/2017   CHOL 196 03/25/2017   TRIG 106.0 03/25/2017   HDL 60.00 03/25/2017   LDLDIRECT 107.0 03/25/2017   LDLCALC 115 (H) 03/25/2017   ALT 29 03/25/2017   AST 27 03/25/2017   NA 139 03/25/2017   K 4.2 03/25/2017   CL 103 03/25/2017   CREATININE 0.67 03/25/2017   BUN 17 03/25/2017   CO2 28 03/25/2017   TSH 2.12 03/25/2017   HGBA1C 5.8 03/25/2017   MICROALBUR 0.3 10/25/2013    Mr Brain W Wo Contrast  Result Date: 01/04/2017 CLINICAL DATA:  Multiple sclerosis. Abnormal balance. Falls. Vision changes. EXAM: MRI HEAD WITHOUT CONTRAST MRI CERVICAL SPINE WITHOUT CONTRAST TECHNIQUE: Multiplanar, multiecho pulse sequences of the brain and surrounding structures, and cervical spine, to include the craniocervical junction and cervicothoracic junction, were obtained without intravenous contrast. COMPARISON:  None. FINDINGS: MRI HEAD FINDINGS Brain: Minimal periventricular T2 signal changes are chest slightly advanced for age. Slight T2 changes are noted along the body of the corpus callosum (image 12, series 10) there is no associated restricted diffusion or enhancement associated with these lesions. No acute infarct, hemorrhage, or mass lesion is present. The ventricles are of normal size. No significant extra-axial fluid collection is present. Vascular: Flow is present in the major intracranial arteries. Skull and upper cervical spine: The skullbase is within normal limits. Craniocervical junction is normal. Sinuses/Orbits: The paranasal sinuses and mastoid air cells are clear. The globes and orbits are within normal limits. MRI CERVICAL SPINE FINDINGS Alignment: AP alignment is anatomic. There is  slight straightening of the normal cervical lordosis. Vertebrae: Marrow signal and vertebral body heights are normal. Cord: There is some artifact on the sagittal T2 sequence. No focal cord signal abnormality is present from the skullbase through the lowest imaged level, T2-3. Posterior Fossa, vertebral arteries, paraspinal tissues: The craniocervical junction is within normal limits. Flow is present in the vertebral artery is bilaterally. The visualized paraspinous soft tissues are within normal limits. Disc levels: C2-3:  Negative. C3-4: Asymmetric right-sided uncovertebral spurring contributes to mild right foraminal narrowing. C4-5: A rightward disc osteophyte complex present. Uncovertebral spurring is worse on the right. Moderate foraminal narrowing is worse on the right. C5-6: A broad-based disc osteophyte complex effaces the ventral CSF. Severe right and moderate left foraminal stenosis is present. C6-7: A broad-based disc osteophyte complex partially effaces the ventral CSF. Uncovertebral spurring contributes to severe right and moderate left foraminal stenosis. C7-T1:  Mild foraminal narrowing is present bilaterally. IMPRESSION: 1. Minimal periventricular T2 signal changes in some involvement of the body of the corpus callosum could be related to a demyelinating process. These changes are nonspecific and can be seen in relation to  chronic microvascular ischemia, prior infection, or inflammation. 2. No focal signal abnormality within the cervical spinal cord. 3. Mild right foraminal narrowing at C3-4 is worse on the right. 4. Moderate right foraminal stenosis at C4-5. 5. Severe right and moderate left foraminal stenosis at C5-6 and C6-7. 6. The right central canal narrowing at C5-6 and C6-7. Electronically Signed   By: Marin Roberts M.D.   On: 01/04/2017 13:24   Mr Cervical Spine W Wo Contrast  Result Date: 01/04/2017 CLINICAL DATA:  Multiple sclerosis. Abnormal balance. Falls. Vision changes.  EXAM: MRI HEAD WITHOUT CONTRAST MRI CERVICAL SPINE WITHOUT CONTRAST TECHNIQUE: Multiplanar, multiecho pulse sequences of the brain and surrounding structures, and cervical spine, to include the craniocervical junction and cervicothoracic junction, were obtained without intravenous contrast. COMPARISON:  None. FINDINGS: MRI HEAD FINDINGS Brain: Minimal periventricular T2 signal changes are chest slightly advanced for age. Slight T2 changes are noted along the body of the corpus callosum (image 12, series 10) there is no associated restricted diffusion or enhancement associated with these lesions. No acute infarct, hemorrhage, or mass lesion is present. The ventricles are of normal size. No significant extra-axial fluid collection is present. Vascular: Flow is present in the major intracranial arteries. Skull and upper cervical spine: The skullbase is within normal limits. Craniocervical junction is normal. Sinuses/Orbits: The paranasal sinuses and mastoid air cells are clear. The globes and orbits are within normal limits. MRI CERVICAL SPINE FINDINGS Alignment: AP alignment is anatomic. There is slight straightening of the normal cervical lordosis. Vertebrae: Marrow signal and vertebral body heights are normal. Cord: There is some artifact on the sagittal T2 sequence. No focal cord signal abnormality is present from the skullbase through the lowest imaged level, T2-3. Posterior Fossa, vertebral arteries, paraspinal tissues: The craniocervical junction is within normal limits. Flow is present in the vertebral artery is bilaterally. The visualized paraspinous soft tissues are within normal limits. Disc levels: C2-3:  Negative. C3-4: Asymmetric right-sided uncovertebral spurring contributes to mild right foraminal narrowing. C4-5: A rightward disc osteophyte complex present. Uncovertebral spurring is worse on the right. Moderate foraminal narrowing is worse on the right. C5-6: A  broad-based disc osteophyte complex  effaces the ventral CSF. Severe right and moderate left foraminal stenosis is present. C6-7: A broad-based disc osteophyte complex partially effaces the ventral CSF. Uncovertebral spurring contributes to severe right and moderate left foraminal stenosis. C7-T1:  Mild foraminal narrowing is present bilaterally. IMPRESSION: 1. Minimal periventricular T2 signal changes in some involvement of the body of the corpus callosum could be related to a demyelinating process. These changes are nonspecific and can be seen in relation to chronic microvascular ischemia, prior infection, or inflammation. 2. No focal signal abnormality within the cervical spinal cord. 3. Mild right foraminal narrowing at C3-4 is worse on the right. 4. Moderate right foraminal stenosis at C4-5. 5. Severe right and moderate left foraminal stenosis at C5-6 and C6-7. 6. The right central canal narrowing at C5-6 and C6-7. Electronically Signed   By: Marin Roberts M.D.   On: 01/04/2017 13:24    Assessment & Plan:   Problem List Items Addressed This Visit    Constipation    Her attempts to treat have resulted in fecal urgency and incontinence.  Recommended trial of  Colace ; reviewed water and fiber intake.       Foraminal stenosis of cervical region    Multiple levels, right greater than left  Seen  on August 2018 MRI . The most severe changes were  from C5 to C7 on the right,  And Moderate changes were seen on the left from C4 to C7         Generalized anxiety disorder    Managed with lexapro and prn use of alprazolam.  The risks and benefits of benzodiazepine use were  Reviewed with patient today including excessive sedation leading to respiratory depression,  impaired thinking/driving, and addiction.  Patient was advised to avoid concurrent use with alcohol, to use medication only as needed and not to share with others  .       Relevant Medications   escitalopram (LEXAPRO) 10 MG tablet   Impaired fasting glucose    Improved  a1c with low glycemic index diet resulting in significant weight loss .  Lab Results  Component Value Date   HGBA1C 5.8 03/25/2017         Relevant Orders   Hemoglobin A1c   Comprehensive metabolic panel   Multiple sclerosis, relapsing-remitting (HCC)    She has had a brain and cervical MRI repeated in late August by her neurologist Dr Sherryll BurgerShah due to worsening balance and recent falls. Minimal change in the corpus callosum was noted .        Obesity (BMI 30-39.9)    I have congratulated her in reduction of   BMI and encouraged  Continued weight loss with goal of 10% of body weigh over the next 6 months using a low glycemic index diet and regular exercise a minimum of 5 days per week.        White coat syndrome without diagnosis of hypertension    Improved readings with weight loss .  No indication for therapy other than metoprolol  for SVT        Other Visit Diagnoses    Hyperlipidemia LDL goal <130    -  Primary   Relevant Medications   metoprolol succinate (TOPROL-XL) 50 MG 24 hr tablet   Other Relevant Orders   Lipid panel   Need for immunization against influenza       Relevant Orders   Flu Vaccine QUAD 36+ mos IM (Completed)     A total of 25  minutes was spent with patient more than half of which was spent in counseling patient on the above mentioned issues , reviewing and explaining recent labs and imaging studies done, and coordination of care.   I am having Janus MolderErika L. Matsen maintain her oxybutynin, Vitamin D3, multivitamin, REBIF, Cranberry, Magnesium, ALPRAZolam, escitalopram, and metoprolol succinate.  Meds ordered this encounter  Medications  . escitalopram (LEXAPRO) 10 MG tablet    Sig: Take 1 tablet (10 mg total) by mouth daily.    Dispense:  90 tablet    Refill:  3    KEEP ON FILE FOR FUTURE REFILLS  . metoprolol succinate (TOPROL-XL) 50 MG 24 hr tablet    Sig: Take 1 tablet (50 mg total) by mouth daily.    Dispense:  90 tablet    Refill:  1     Medications Discontinued During This Encounter  Medication Reason  . escitalopram (LEXAPRO) 10 MG tablet Reorder  . metoprolol succinate (TOPROL-XL) 50 MG 24 hr tablet Reorder    Follow-up: Return in about 3 months (around 07/07/2017) for follow up on prediabetes.   Sherlene Shamseresa L Martrell Eguia, MD

## 2017-04-07 NOTE — Patient Instructions (Addendum)
CONGRATULATIONS!  KEEP DOING WHAT YOU ARE DOING AD WE WILL REPEAT YOUR LABS IN 3 MONTHS     try colace (docusate ) start with 50 mg ,  This is a stool softener.  This is much more gentle than a laxative.

## 2017-04-10 ENCOUNTER — Encounter: Payer: Self-pay | Admitting: Internal Medicine

## 2017-04-10 DIAGNOSIS — M4802 Spinal stenosis, cervical region: Secondary | ICD-10-CM | POA: Insufficient documentation

## 2017-04-10 DIAGNOSIS — K5900 Constipation, unspecified: Secondary | ICD-10-CM | POA: Insufficient documentation

## 2017-04-10 NOTE — Assessment & Plan Note (Signed)
Improved readings with weight loss .  No indication for therapy other than metoprolol  for SVT

## 2017-04-10 NOTE — Assessment & Plan Note (Signed)
I have congratulated her in reduction of   BMI and encouraged  Continued weight loss with goal of 10% of body weigh over the next 6 months using a low glycemic index diet and regular exercise a minimum of 5 days per week.    

## 2017-04-10 NOTE — Assessment & Plan Note (Signed)
Managed with lexapro and prn use of alprazolam.  The risks and benefits of benzodiazepine use were  Reviewed with patient today including excessive sedation leading to respiratory depression,  impaired thinking/driving, and addiction.  Patient was advised to avoid concurrent use with alcohol, to use medication only as needed and not to share with others  .

## 2017-04-10 NOTE — Assessment & Plan Note (Signed)
Her attempts to treat have resulted in fecal urgency and incontinence.  Recommended trial of  Colace ; reviewed water and fiber intake.

## 2017-04-10 NOTE — Assessment & Plan Note (Addendum)
She has had a brain and cervical MRI repeated in late August by her neurologist Dr Sherryll Burger due to worsening balance and recent falls. Minimal change in the corpus callosum was noted .

## 2017-04-10 NOTE — Assessment & Plan Note (Signed)
Improved a1c with low glycemic index diet resulting in significant weight loss .  Lab Results  Component Value Date   HGBA1C 5.8 03/25/2017

## 2017-04-10 NOTE — Assessment & Plan Note (Signed)
Multiple levels, right greater than left  Seen  on August 2018 MRI . The most severe changes were from C5 to C7 on the right,  And Moderate changes were seen on the left from C4 to C7

## 2017-04-25 ENCOUNTER — Ambulatory Visit: Payer: Managed Care, Other (non HMO) | Admitting: Internal Medicine

## 2017-05-19 ENCOUNTER — Other Ambulatory Visit: Payer: Self-pay | Admitting: Internal Medicine

## 2017-05-25 ENCOUNTER — Ambulatory Visit: Payer: Managed Care, Other (non HMO) | Admitting: Internal Medicine

## 2017-07-13 ENCOUNTER — Other Ambulatory Visit (INDEPENDENT_AMBULATORY_CARE_PROVIDER_SITE_OTHER): Payer: Managed Care, Other (non HMO)

## 2017-07-13 DIAGNOSIS — E785 Hyperlipidemia, unspecified: Secondary | ICD-10-CM | POA: Diagnosis not present

## 2017-07-13 DIAGNOSIS — R7301 Impaired fasting glucose: Secondary | ICD-10-CM

## 2017-07-13 LAB — LIPID PANEL
CHOL/HDL RATIO: 3
CHOLESTEROL: 192 mg/dL (ref 0–200)
HDL: 61.9 mg/dL (ref >39.00)
LDL CALC: 114 mg/dL — AB (ref 0–99)
NonHDL: 130.34
TRIGLYCERIDES: 82 mg/dL (ref 0.0–149.0)
VLDL: 16.4 mg/dL (ref 0.0–40.0)

## 2017-07-13 LAB — COMPREHENSIVE METABOLIC PANEL
ALBUMIN: 4 g/dL (ref 3.5–5.2)
ALK PHOS: 44 U/L (ref 39–117)
ALT: 22 U/L (ref 0–35)
AST: 22 U/L (ref 0–37)
BUN: 17 mg/dL (ref 6–23)
CO2: 28 mEq/L (ref 19–32)
CREATININE: 0.71 mg/dL (ref 0.40–1.20)
Calcium: 9.8 mg/dL (ref 8.4–10.5)
Chloride: 101 mEq/L (ref 96–112)
GFR: 91.36 mL/min (ref >60.00)
GLUCOSE: 86 mg/dL (ref 70–99)
POTASSIUM: 4.3 meq/L (ref 3.5–5.1)
SODIUM: 137 meq/L (ref 135–145)
TOTAL PROTEIN: 7.4 g/dL (ref 6.0–8.3)
Total Bilirubin: 0.5 mg/dL (ref 0.2–1.2)

## 2017-07-13 LAB — HEMOGLOBIN A1C: Hgb A1c MFr Bld: 6 % (ref 4.6–6.5)

## 2017-07-14 ENCOUNTER — Other Ambulatory Visit: Payer: Managed Care, Other (non HMO)

## 2017-07-15 ENCOUNTER — Ambulatory Visit (INDEPENDENT_AMBULATORY_CARE_PROVIDER_SITE_OTHER): Payer: Managed Care, Other (non HMO) | Admitting: Internal Medicine

## 2017-07-15 ENCOUNTER — Encounter: Payer: Self-pay | Admitting: Internal Medicine

## 2017-07-15 VITALS — BP 138/80 | HR 78 | Temp 97.5°F | Resp 15 | Ht 66.0 in | Wt 201.4 lb

## 2017-07-15 DIAGNOSIS — E669 Obesity, unspecified: Secondary | ICD-10-CM

## 2017-07-15 DIAGNOSIS — R030 Elevated blood-pressure reading, without diagnosis of hypertension: Secondary | ICD-10-CM

## 2017-07-15 DIAGNOSIS — I872 Venous insufficiency (chronic) (peripheral): Secondary | ICD-10-CM | POA: Diagnosis not present

## 2017-07-15 DIAGNOSIS — R7303 Prediabetes: Secondary | ICD-10-CM

## 2017-07-15 DIAGNOSIS — R5383 Other fatigue: Secondary | ICD-10-CM

## 2017-07-15 NOTE — Patient Instructions (Addendum)
Your circulation is fine,  You have very mild venous insufficiency ,  Which results In red toes because  the blood will  pool in the feet . This is Not an arterial problem.  Elevating legs helps,  exercising calves helps,  And wearing compression stockings during prolonged periods of sitting or standing will help.   [email or web address] for compression knee highs   The new goals for optimal blood pressure management are 120/70.  Please check your blood pressure a few times at home and send me the readings so I can determine if you need  medication   Your  fasting glucose has never been  diagnostic of diabetes; but your A1c continues to suggest that  you are at risk for developing type 2 Diabetes.  Continuing the excellent lifestyle changes that you have made should delay the progression to diabetes for a long time.     I would like to see you again  In 3 months

## 2017-07-15 NOTE — Progress Notes (Signed)
Subjective:  Patient ID: Natalie Pugh, female    DOB: 04/26/1966  Age: 52 y.o. MRN: [id number]  CC: The primary encounter diagnosis was Fatigue, unspecified type. Diagnoses of White coat syndrome without diagnosis of hypertension, Prediabetes, Obesity (BMI 30-39.9), and Venous insufficiency of both lower extremities were also pertinent to this visit.  HPI Natalie Pugh presents for follow up on obesity,  Hypertension, and  Prediabetes.  She has been restricting her diet and exercising several times per week. For short periods of time as allowed by her condition of MS.  She does note leg weakness that recurs if she rides the bike for > 15 minutes.  She reached a nadir of 193 lbs,  But gaind a few over the holidays due to indulgences.  Her personal immediate goal is  190 lbs.  Eventual goal is 160 lbs.   Following the KETO DIET, which was discussed today in detail with patient.   2)  Purple toes.  Her husband noticed that her feet become purple over time and he is worried about her circulation.  She denies  Pain in either leg.  She has noted  The development of spider veins  And more discoloration in the left leg , which is her weaker leg.    Outpatient Medications Prior to Visit  Medication Sig Dispense Refill  . ALPRAZolam (XANAX) 0.5 MG tablet Take 2 tablets (1 mg total) by mouth 2 (two) times daily as needed for anxiety or sleep. 60 tablet 5  . Cholecalciferol (VITAMIN D3) 1000 UNITS CAPS Take 2 capsules by mouth daily.     . Cranberry 250 MG CAPS Take 2 capsules by mouth daily.    . Magnesium 200 MG TABS Take 1 tablet by mouth daily.    . metoprolol succinate (TOPROL-XL) 50 MG 24 hr tablet TAKE 1 TABLET(50 MG) BY MOUTH DAILY 90 tablet 0  . Multiple Vitamin (MULTIVITAMIN) tablet Take 1 tablet by mouth daily.    Marland Kitchen oxybutynin (DITROPAN-XL) 5 MG 24 hr tablet Take 5 mg by mouth daily as needed.     . REBIF 44 MCG/0.5ML injection INJECT 1 PREFILLED SYRINGE SUBCUTANEOUSLY 3 TIMES PER WEEK  AS DIRECTED 18 mL 0  . escitalopram (LEXAPRO) 10 MG tablet TAKE 1 TABLET BY MOUTH EVERY DAY (Patient not taking: Reported on 07/15/2017) 90 tablet 0   No facility-administered medications prior to visit.     Review of Systems;  Patient denies headache, fevers, malaise, unintentional weight loss, skin rash, eye pain, sinus congestion and sinus pain, sore throat, dysphagia,  hemoptysis , cough, dyspnea, wheezing, chest pain, palpitations, orthopnea, edema, abdominal pain, nausea, melena, diarrhea, constipation, flank pain, dysuria, hematuria, urinary  Frequency, nocturia, numbness, tingling, seizures,  Focal weakness, Loss of consciousness,  Tremor, insomnia, depression, anxiety, and suicidal ideation.      Objective:  BP 138/80 (BP Location: Left Arm, Patient Position: Sitting, Cuff Size: Large)   Pulse 78   Temp (!) 97.5 F (36.4 C) (Oral)   Resp 15   Ht 5\' 6"  (1.676 m)   Wt 201 lb 6.4 oz (91.4 kg)   LMP 09/28/2014 (Exact Date)   SpO2 96%   BMI 32.51 kg/m   BP Readings from Last 3 Encounters:  07/15/17 138/80  04/07/17 130/88  11/24/16 (!) 138/94    Wt Readings from Last 3 Encounters:  07/15/17 201 lb 6.4 oz (91.4 kg)  04/07/17 198 lb (89.8 kg)  11/24/16 217 lb (98.4 kg)    General appearance: alert,  cooperative and appears stated age Ears: normal TM's and external ear canals both ears Throat: lips, mucosa, and tongue normal; teeth and gums normal Neck: no adenopathy, no carotid bruit, supple, symmetrical, trachea midline and thyroid not enlarged, symmetric, no tenderness/mass/nodules Back: symmetric, no curvature. ROM normal. No CVA tenderness. Lungs: clear to auscultation bilaterally Heart: regular rate and rhythm, S1, S2 normal, no murmur, click, rub or gallop Abdomen: soft, non-tender; bowel sounds normal; no masses,  no organomegaly Pulses: 2+ and symmetric, cap refill < 2 sec  .  Skin: Skin color, texture, turgor normal. No rashes or lesions Lymph nodes: Cervical,  supraclavicular, and axillary nodes normal.  Lab Results  Component Value Date   HGBA1C 6.0 07/13/2017   HGBA1C 5.8 03/25/2017   HGBA1C 6.1 11/24/2016    Lab Results  Component Value Date   CREATININE 0.71 07/13/2017   CREATININE 0.67 03/25/2017   CREATININE 0.82 11/24/2016    Lab Results  Component Value Date   WBC 3.7 (L) 03/25/2017   HGB 12.4 03/25/2017   HCT 37.7 03/25/2017   PLT 227.0 03/25/2017   GLUCOSE 86 07/13/2017   CHOL 192 07/13/2017   TRIG 82.0 07/13/2017   HDL 61.90 07/13/2017   LDLDIRECT 107.0 03/25/2017   LDLCALC 114 (H) 07/13/2017   ALT 22 07/13/2017   AST 22 07/13/2017   NA 137 07/13/2017   K 4.3 07/13/2017   CL 101 07/13/2017   CREATININE 0.71 07/13/2017   BUN 17 07/13/2017   CO2 28 07/13/2017   TSH 2.12 03/25/2017   HGBA1C 6.0 07/13/2017   MICROALBUR <-0.7 (L) 07/15/2017     Assessment & Plan:   Problem List Items Addressed This Visit    Obesity (BMI 30-39.9)    I have encouraged  Continued weight loss with goal of 10% of body weight over the next 6 months using a low glycemic index diet and regular exercise a minimum of 5 days per week.        Prediabetes    Her A1c improved with low glycemic index diet and  weight loss  And increased recently due to lapses in dietary restraint and weight gain.   Lab Results  Component Value Date   HGBA1C 6.0 07/13/2017         Venous insufficiency of both lower extremities    Reassured her that her signs are due to venous stasis.   Compression knee highs recommended       White coat syndrome without diagnosis of hypertension     She has no proteinuria.  elevation noted today.  Patient has been asked to check bp at home and submit readings in 2 weeks.   Lab Results  Component Value Date   MICROALBUR <-0.7 (L) 07/15/2017         Relevant Orders   Microalbumin / creatinine urine ratio (Completed)    Other Visit Diagnoses    Fatigue, unspecified type    -  Primary    A total of 25  minutes of face to face time was spent with patient more than half of which was spent in counselling about the above mentioned conditions  and coordination of care   I am having Girtrude L. Fortson maintain her oxybutynin, Vitamin D3, multivitamin, REBIF, Cranberry, Magnesium, ALPRAZolam, escitalopram, and metoprolol succinate.  No orders of the defined types were placed in this encounter.   There are no discontinued medications.  Follow-up: Return in about 3 months (around 10/15/2017).   Sherlene Shams, MD

## 2017-07-17 DIAGNOSIS — I872 Venous insufficiency (chronic) (peripheral): Secondary | ICD-10-CM | POA: Insufficient documentation

## 2017-07-17 NOTE — Assessment & Plan Note (Signed)
I have  encouraged  Continued weight loss with goal of 10% of body weight over the next 6 months using a low glycemic index diet and regular exercise a minimum of 5 days per week.   

## 2017-07-17 NOTE — Assessment & Plan Note (Signed)
Reassured her that her signs are due to venous stasis.   Compression knee highs recommended

## 2017-07-17 NOTE — Assessment & Plan Note (Addendum)
Her A1c improved with low glycemic index diet and  weight loss  And increased recently due to lapses in dietary restraint and weight gain.   Lab Results  Component Value Date   HGBA1C 6.0 07/13/2017

## 2017-07-17 NOTE — Assessment & Plan Note (Addendum)
She has no proteinuria.  elevation noted today.  Patient has been asked to check bp at home and submit readings in 2 weeks.   Lab Results  Component Value Date   MICROALBUR <-0.7 (L) 07/15/2017

## 2017-07-18 ENCOUNTER — Encounter: Payer: Self-pay | Admitting: Internal Medicine

## 2017-07-18 LAB — MICROALBUMIN / CREATININE URINE RATIO
Creatinine,U: 31.5 mg/dL
MICROALB/CREAT RATIO: 2.2 mg/g (ref 0.0–30.0)

## 2017-08-22 ENCOUNTER — Other Ambulatory Visit: Payer: Self-pay

## 2017-08-22 DIAGNOSIS — F5101 Primary insomnia: Secondary | ICD-10-CM

## 2017-08-22 NOTE — Telephone Encounter (Signed)
Refilled: 11/24/2016 Last OV: 07/15/2017 Next OV: 12/28/2017

## 2017-08-23 MED ORDER — ALPRAZOLAM 0.5 MG PO TABS: 1.0000 mg | ORAL_TABLET | Freq: Two times a day (BID) | ORAL | 5 refills | Status: DC | PRN

## 2017-08-24 NOTE — Telephone Encounter (Signed)
Printed, signed and faxed.  

## 2017-10-06 ENCOUNTER — Encounter: Payer: Managed Care, Other (non HMO) | Admitting: Internal Medicine

## 2017-11-09 ENCOUNTER — Ambulatory Visit: Payer: Managed Care, Other (non HMO) | Admitting: Family Medicine

## 2017-11-13 ENCOUNTER — Other Ambulatory Visit: Payer: Self-pay | Admitting: Internal Medicine

## 2017-11-15 ENCOUNTER — Telehealth: Payer: Self-pay

## 2017-11-15 NOTE — Telephone Encounter (Signed)
Copied from CRM [id number]. Topic: Inquiry >> Nov 15, 2017  8:16 AM Yvonna Alanis wrote: Reason for CRM: Patient states that her BP has been running high. Patient would like a call from Dr. Darrick Huntsman. Patient states that Dr. Darrick Huntsman wanted her to call in to re-adjust her meds. Please call patient at [phone].       Thank You!!!

## 2017-11-15 NOTE — Telephone Encounter (Signed)
Called patient and she stated that she has been having elevated blood pressures for the last few weeks. She initially thought it was due to some are pain she was having and she was taking tylenol and ibuprofen for the discomfort and thought that was the reason for the elevation. Her BP this morning was 128/91, the following has been over the last few days but she did not have the dates  BP: 166/102 P:71 BP: 159/104 P:74 BP: 127/91   P: 83 BP: 119/92   P: 84 BP: 150/102 P: 78 BP: 153/98   P: 79 BP: 145/100 P: 73 Patient states that she is taking metoprolol 50mg  daily

## 2017-11-15 NOTE — Telephone Encounter (Signed)
IF SHE HAS STOPPED THE IBUPROFEN 72 HOURS AGO,  THEN WE CAN RULE OUT THE MEDICATION BEING THE CAUSE .  If she is still taking ibuprofen,  She should stop and recheck BP in 48 to 72 hours.  Tylenol ok,  NO ALEVE EITHER

## 2017-11-15 NOTE — Telephone Encounter (Signed)
See other message that has been routed to Dr. Darrick Huntsman.

## 2017-11-16 NOTE — Telephone Encounter (Signed)
Patient notified of recommendations and verbalized understanding and states that she has stopped taking ibuprofen.

## 2017-12-28 ENCOUNTER — Other Ambulatory Visit (HOSPITAL_COMMUNITY)
Admission: RE | Admit: 2017-12-28 | Discharge: 2017-12-28 | Disposition: A | Discharge status: Home / Self Care | Payer: Managed Care, Other (non HMO) | Source: Ambulatory Visit | Attending: Internal Medicine | Admitting: Internal Medicine

## 2017-12-28 ENCOUNTER — Encounter: Payer: Self-pay | Admitting: Internal Medicine

## 2017-12-28 ENCOUNTER — Ambulatory Visit (INDEPENDENT_AMBULATORY_CARE_PROVIDER_SITE_OTHER): Payer: Managed Care, Other (non HMO) | Admitting: Internal Medicine

## 2017-12-28 VITALS — BP 138/96 | HR 84 | Temp 97.7°F | Resp 15 | Ht 66.0 in | Wt 193.0 lb

## 2017-12-28 DIAGNOSIS — M25511 Pain in right shoulder: Secondary | ICD-10-CM

## 2017-12-28 DIAGNOSIS — R030 Elevated blood-pressure reading, without diagnosis of hypertension: Secondary | ICD-10-CM

## 2017-12-28 DIAGNOSIS — Z124 Encounter for screening for malignant neoplasm of cervix: Secondary | ICD-10-CM | POA: Diagnosis not present

## 2017-12-28 DIAGNOSIS — N761 Subacute and chronic vaginitis: Secondary | ICD-10-CM | POA: Insufficient documentation

## 2017-12-28 DIAGNOSIS — Z1239 Encounter for other screening for malignant neoplasm of breast: Secondary | ICD-10-CM

## 2017-12-28 DIAGNOSIS — Z0000 Encounter for general adult medical examination without abnormal findings: Secondary | ICD-10-CM

## 2017-12-28 DIAGNOSIS — K5901 Slow transit constipation: Secondary | ICD-10-CM

## 2017-12-28 DIAGNOSIS — R7303 Prediabetes: Secondary | ICD-10-CM | POA: Diagnosis not present

## 2017-12-28 DIAGNOSIS — F411 Generalized anxiety disorder: Secondary | ICD-10-CM

## 2017-12-28 DIAGNOSIS — N898 Other specified noninflammatory disorders of vagina: Secondary | ICD-10-CM | POA: Diagnosis not present

## 2017-12-28 DIAGNOSIS — Z1151 Encounter for screening for human papillomavirus (HPV): Secondary | ICD-10-CM | POA: Diagnosis not present

## 2017-12-28 DIAGNOSIS — I10 Essential (primary) hypertension: Secondary | ICD-10-CM

## 2017-12-28 DIAGNOSIS — Z0001 Encounter for general adult medical examination with abnormal findings: Secondary | ICD-10-CM

## 2017-12-28 DIAGNOSIS — E669 Obesity, unspecified: Secondary | ICD-10-CM

## 2017-12-28 LAB — COMPREHENSIVE METABOLIC PANEL
ALT: 17 U/L (ref 0–35)
AST: 18 U/L (ref 0–37)
Albumin: 4.4 g/dL (ref 3.5–5.2)
Alkaline Phosphatase: 44 U/L (ref 39–117)
BILIRUBIN TOTAL: 0.6 mg/dL (ref 0.2–1.2)
BUN: 12 mg/dL (ref 6–23)
CALCIUM: 10 mg/dL (ref 8.4–10.5)
CO2: 29 meq/L (ref 19–32)
CREATININE: 0.73 mg/dL (ref 0.40–1.20)
Chloride: 98 mEq/L (ref 96–112)
GFR: 88.32 mL/min (ref >60.00)
GLUCOSE: 87 mg/dL (ref 70–99)
Potassium: 4.4 mEq/L (ref 3.5–5.1)
SODIUM: 135 meq/L (ref 135–145)
Total Protein: 7.1 g/dL (ref 6.0–8.3)

## 2017-12-28 LAB — HEMOGLOBIN A1C: Hgb A1c MFr Bld: 5.8 % (ref 4.6–6.5)

## 2017-12-28 MED ORDER — TELMISARTAN 20 MG PO TABS: 20.0000 mg | ORAL_TABLET | Freq: Every day | ORAL | 1 refills | Status: DC

## 2017-12-28 NOTE — Patient Instructions (Addendum)
I am adding telmisartan 25 mg daily in the evening for blood pressure.  continue metoprolol in the morning   You can use a Fleet's enema and/or glycerin suppositories daily or as needed   Please try taking a probiotic ( Align, Floraque or Culturelle), the generic version of one of these over the counter medications, or a Pgood quality [place]  Yogurt, with live cultures to prevent vaginitis due to yeast infections and can be continued indefinitely if you feel that it improves your digestion or your elimination (bowels).   I agree with seeing  orthopedics for  your shoulder  ([place])    Health Maintenance for Postmenopausal Women Menopause is a normal process in which your reproductive ability comes to an end. This process happens gradually over a span of months to years, usually between the ages of 33 and 74. Menopause is complete when you have missed 12 consecutive menstrual periods. It is important to talk with your health care provider about some of the most common conditions that affect postmenopausal women, such as heart disease, cancer, and bone loss (osteoporosis). Adopting a healthy lifestyle and getting preventive care can help to promote your health and wellness. Those actions can also lower your chances of developing some of these common conditions. What should I know about menopause? During menopause, you may experience a number of symptoms, such as:  Moderate-to-severe hot flashes.  Night sweats.  Decrease in sex drive.  Mood swings.  Headaches.  Tiredness.  Irritability.  Memory problems.  Insomnia.  Choosing to treat or not to treat menopausal changes is an individual decision that you make with your health care provider. What should I know about hormone replacement therapy and supplements? Hormone therapy products are effective for treating symptoms that are associated with menopause, such as hot flashes and night sweats. Hormone replacement carries certain risks,  especially as you become older. If you are thinking about using estrogen or estrogen with progestin treatments, discuss the benefits and risks with your health care provider. What should I know about heart disease and stroke? Heart disease, heart attack, and stroke become more likely as you age. This may be due, in part, to the hormonal changes that your body experiences during menopause. These can affect how your body processes dietary fats, triglycerides, and cholesterol. Heart attack and stroke are both medical emergencies. There are many things that you can do to help prevent heart disease and stroke:  Have your blood pressure checked at least every 1-2 years. High blood pressure causes heart disease and increases the risk of stroke.  If you are 91-18 years old, ask your health care provider if you should take aspirin to prevent a heart attack or a stroke.  Do not use any tobacco products, including cigarettes, chewing tobacco, or electronic cigarettes. If you need help quitting, ask your health care provider.  It is important to eat a healthy diet and maintain a healthy weight. ? Be sure to include plenty of vegetables, fruits, low-fat dairy products, and lean protein. ? Avoid eating foods that are high in solid fats, added sugars, or salt (sodium).  Get regular exercise. This is one of the most important things that you can do for your health. ? Try to exercise for at least 150 minutes each week. The type of exercise that you do should increase your heart rate and make you sweat. This is known as moderate-intensity exercise. ? Try to do strengthening exercises at least twice each week. Do these in addition  to the moderate-intensity exercise.  Know your numbers.Ask your health care provider to check your cholesterol and your blood glucose. Continue to have your blood tested as directed by your health care provider.  What should I know about cancer screening? There are several types of  cancer. Take the following steps to reduce your risk and to catch any cancer development as early as possible. Breast Cancer  Practice breast self-awareness. ? This means understanding how your breasts normally appear and feel. ? It also means doing regular breast self-exams. Let your health care provider know about any changes, no matter how small.  If you are 31 or older, have a clinician do a breast exam (clinical breast exam or CBE) every year. Depending on your age, family history, and medical history, it may be recommended that you also have a yearly breast X-ray (mammogram).  If you have a family history of breast cancer, talk with your health care provider about genetic screening.  If you are at high risk for breast cancer, talk with your health care provider about having an MRI and a mammogram every year.  Breast cancer (BRCA) gene test is recommended for women who have family members with BRCA-related cancers. Results of the assessment will determine the need for genetic counseling and BRCA1 and for BRCA2 testing. BRCA-related cancers include these types: ? Breast. This occurs in males or females. ? Ovarian. ? Tubal. This may also be called fallopian tube cancer. ? Cancer of the abdominal or pelvic lining (peritoneal cancer). ? Prostate. ? Pancreatic.  Cervical, Uterine, and Ovarian Cancer Your health care provider may recommend that you be screened regularly for cancer of the pelvic organs. These include your ovaries, uterus, and vagina. This screening involves a pelvic exam, which includes checking for microscopic changes to the surface of your cervix (Pap test).  For women ages 21-65, health care providers may recommend a pelvic exam and a Pap test every three years. For women ages 31-65, they may recommend the Pap test and pelvic exam, combined with testing for human papilloma virus (HPV), every five years. Some types of HPV increase your risk of cervical cancer. Testing for HPV  may also be done on women of any age who have unclear Pap test results.  Other health care providers may not recommend any screening for nonpregnant women who are considered low risk for pelvic cancer and have no symptoms. Ask your health care provider if a screening pelvic exam is right for you.  If you have had past treatment for cervical cancer or a condition that could lead to cancer, you need Pap tests and screening for cancer for at least 20 years after your treatment. If Pap tests have been discontinued for you, your risk factors (such as having a new sexual partner) need to be reassessed to determine if you should start having screenings again. Some women have medical problems that increase the chance of getting cervical cancer. In these cases, your health care provider may recommend that you have screening and Pap tests more often.  If you have a family history of uterine cancer or ovarian cancer, talk with your health care provider about genetic screening.  If you have vaginal bleeding after reaching menopause, tell your health care provider.  There are currently no reliable tests available to screen for ovarian cancer.  Lung Cancer Lung cancer screening is recommended for adults 107-4 years old who are at high risk for lung cancer because of a history of smoking. A yearly  low-dose CT scan of the lungs is recommended if you:  Currently smoke.  Have a history of at least 30 pack-years of smoking and you currently smoke or have quit within the past 15 years. A pack-year is smoking an average of one pack of cigarettes per day for one year.  Yearly screening should:  Continue until it has been 15 years since you quit.  Stop if you develop a health problem that would prevent you from having lung cancer treatment.  Colorectal Cancer  This type of cancer can be detected and can often be prevented.  Routine colorectal cancer screening usually begins at age 92 and continues through age  33.  If you have risk factors for colon cancer, your health care provider may recommend that you be screened at an earlier age.  If you have a family history of colorectal cancer, talk with your health care provider about genetic screening.  Your health care provider may also recommend using home test kits to check for hidden blood in your stool.  A small camera at the end of a tube can be used to examine your colon directly (sigmoidoscopy or colonoscopy). This is done to check for the earliest forms of colorectal cancer.  Direct examination of the colon should be repeated every 5-10 years until age 19. However, if early forms of precancerous polyps or small growths are found or if you have a family history or genetic risk for colorectal cancer, you may need to be screened more often.  Skin Cancer  Check your skin from head to toe regularly.  Monitor any moles. Be sure to tell your health care provider: ? About any new moles or changes in moles, especially if there is a change in a mole's shape or color. ? If you have a mole that is larger than the size of a pencil eraser.  If any of your family members has a history of skin cancer, especially at a young age, talk with your health care provider about genetic screening.  Always use sunscreen. Apply sunscreen liberally and repeatedly throughout the day.  Whenever you are outside, protect yourself by wearing long sleeves, pants, a wide-brimmed hat, and sunglasses.  What should I know about osteoporosis? Osteoporosis is a condition in which bone destruction happens more quickly than new bone creation. After menopause, you may be at an increased risk for osteoporosis. To help prevent osteoporosis or the bone fractures that can happen because of osteoporosis, the following is recommended:  If you are 93-32 years old, get at least 1,000 mg of calcium and at least 600 mg of vitamin D per day.  If you are older than age 12 but younger than age  41, get at least 1,200 mg of calcium and at least 600 mg of vitamin D per day.  If you are older than age 96, get at least 1,200 mg of calcium and at least 800 mg of vitamin D per day.  Smoking and excessive alcohol intake increase the risk of osteoporosis. Eat foods that are rich in calcium and vitamin D, and do weight-bearing exercises several times each week as directed by your health care provider. What should I know about how menopause affects my mental health? Depression may occur at any age, but it is more common as you become older. Common symptoms of depression include:  Low or sad mood.  Changes in sleep patterns.  Changes in appetite or eating patterns.  Feeling an overall lack of motivation or enjoyment  of activities that you previously enjoyed.  Frequent crying spells.  Talk with your health care provider if you think that you are experiencing depression. What should I know about immunizations? It is important that you get and maintain your immunizations. These include:  Tetanus, diphtheria, and pertussis (Tdap) booster vaccine.  Influenza every year before the flu season begins.  Pneumonia vaccine.  Shingles vaccine.  Your health care provider may also recommend other immunizations. This information is not intended to replace advice given to you by your health care provider. Make sure you discuss any questions you have with your health care provider. Document Released: 06/18/2005 Document Revised: 11/14/2015 Document Reviewed: 01/28/2015 [employer]vier Interactive Patient Education  2018 [employer].

## 2017-12-28 NOTE — Progress Notes (Signed)
Patient ID: Natalie Pugh, female    DOB: 02-25-1966  Age: 52 y.o. MRN: [id number]  The patient is here for annual preventive examination and management of other chronic and acute problems.   The risk factors are reflected in the social history.  The roster of all physicians providing medical care to patient - is listed in the Snapshot section of the chart.  Activities of daily living:  The patient is 100% independent in all ADLs: dressing, toileting, feeding as well as independent mobility  Home safety : The patient has smoke detectors in the home. They wear seatbelts.  There are no firearms at home. There is no violence in the home.   There is no risks for hepatitis, STDs or HIV. There is no   history of blood transfusion. They have no travel history to infectious disease endemic areas of the world.  The patient has seen their dentist in the last six month. They have seen their eye doctor in the last year. They admit to slight hearing difficulty with regard to whispered voices and some television programs.  They have deferred audiologic testing in the last year.  They do not  have excessive sun exposure. Discussed the need for sun protection: hats, long sleeves and use of sunscreen if there is significant sun exposure.   Diet: the importance of a healthy diet is discussed. They do have a healthy diet.  The benefits of regular aerobic exercise were discussed. She walks 4 times per week ,  20 minutes.   Depression screen: there are no signs or vegative symptoms of depression- irritability, change in appetite, anhedonia, sadness/tearfullness.  Cognitive assessment: the patient manages all their financial and personal affairs and is actively engaged. They could relate day,date,year and events; recalled 2/3 objects at 3 minutes; performed clock-face test normally.  The following portions of the patient's history were reviewed and updated as appropriate: allergies, current medications, past  family history, past medical history,  past surgical history, past social history  and problem list.  Visual acuity was not assessed per patient preference since she has regular follow up with her ophthalmologist. Hearing and body mass index were assessed and reviewed.   During the course of the visit the patient was educated and  bagnigcounseled about appropriate screening and preventive services including : fall prevention , diabetes screening, nutrition counseling, colorectal cancer screening, and recommended immunizations.    CC: The primary encounter diagnosis was Cervical cancer screening. Diagnoses of Chronic vaginitis, Breast cancer screening, Prediabetes, Essential hypertension, White coat syndrome without diagnosis of hypertension, Slow transit constipation, Encounter for preventive health examination, Obesity (BMI 30-39.9), Generalized anxiety disorder, Right anterior shoulder pain, and Vaginal odor were also pertinent to this visit.   1) Hypertension: patient checks blood pressure twice weekly at home.  Readings have been for the most part > 120/90 at rest . Was taking Advil daily for a month due to shoulder pain..  , but now just 3 times per week and readings on those day are  No different than the days she is without it.   Patient is following a reduced salt diet most days and is taking medications as prescribed (metoprolol) .  2) prediabetes.  .  Patient is following a low glycemic index diet . Patient is exercising about 3 times per week and intentionally trying to lose weight .  Patient has had an eye exam in the last 12 months and checks feet regularly for signs of infection.  Patient does not  walk barefoot outside,  And denies an numbness tingling or burning in feet.   3)  Right shoulder pain  For several months.  Notes decreased ROM.  Seeing chiropractor for months with no improvement. . Possible rotator cuff  Injury  From either using [employer] for workouts or from bumping  into  wall.  Pain has improved but not resolved.  Recommended Poggi consult  contin 4) Constipation:  Using psyllium daily and stool softener at night 4 times per week. Still has to manually disimpact at least 3-34 times per week . Discussed use of Fleet's enemas    5) Vaginal odor without discharge. Notices it is stronger after intercourse.  Husband also noticed the odor has transferred to him,  has been using vinegar water douche.     Does not use probiotics.    6) ONG:EXBMW fine on Lexaprol 10 mg but feels she dwells on "stupid things"   too long. Not tearful,  Irritable , no panic attacks.  Sleeping fine .  Does not want to see a therapist.   History Natalie Pugh has a past medical history of Anxiety, Diabetes mellitus, Hypertension, Multiple sclerosis, relapsing-remitting (HCC) (2001), Neurogenic bladder, and SVT (supraventricular tachycardia) (HCC).   She has a past surgical history that includes Colonoscopy with propofol (N/A, 01/29/2015) and Lung removal, partial (Left, 2016).   Her family history includes Breast cancer (age of onset: 5) in her maternal aunt; COPD in her mother; Cancer (age of onset: 9) in her maternal aunt; Heart disease in her mother; Hyperlipidemia in her paternal uncle.She reports that she quit smoking about 24 years ago. Her smoking use included cigarettes. She has never used smokeless tobacco. She reports that she drinks alcohol. She reports that she does not use drugs.  Outpatient Medications Prior to Visit  Medication Sig Dispense Refill  . ALPRAZolam (XANAX) 0.5 MG tablet Take 2 tablets (1 mg total) by mouth 2 (two) times daily as needed for anxiety or sleep. 60 tablet 5  . Cholecalciferol (VITAMIN D3) 1000 UNITS CAPS Take 2 capsules by mouth daily.     . Cranberry 250 MG CAPS Take 2 capsules by mouth daily.    Marland Kitchen escitalopram (LEXAPRO) 10 MG tablet TAKE 1 TABLET BY MOUTH EVERY DAY 90 tablet 0  . Magnesium 200 MG TABS Take 1 tablet by mouth daily.    . metoprolol succinate  (TOPROL-XL) 50 MG 24 hr tablet TAKE 1 TABLET(50 MG) BY MOUTH DAILY 90 tablet 0  . metoprolol succinate (TOPROL-XL) 50 MG 24 hr tablet TAKE 1 TABLET(50 MG) BY MOUTH DAILY 90 tablet 1  . Multiple Vitamin (MULTIVITAMIN) tablet Take 1 tablet by mouth daily.    Marland Kitchen oxybutynin (DITROPAN-XL) 5 MG 24 hr tablet Take 5 mg by mouth daily as needed.     . psyllium (REGULOID) 0.52 g capsule Take 0.52 g by mouth daily.    Marland Kitchen REBIF 44 MCG/0.5ML injection INJECT 1 PREFILLED SYRINGE SUBCUTANEOUSLY 3 TIMES PER WEEK AS DIRECTED 18 mL 0   No facility-administered medications prior to visit.     Review of Systems   Patient denies headache, fevers, malaise, unintentional weight loss, skin rash, eye pain, sinus congestion and sinus pain, sore throat, dysphagia,  hemoptysis , cough, dyspnea, wheezing, chest pain, palpitations, orthopnea, edema, abdominal pain, nausea, melena, diarrhea, constipation, flank pain, dysuria, hematuria, urinary  Frequency, nocturia, numbness, tingling, seizures,  Focal weakness, Loss of consciousness,  Tremor, insomnia, depression, anxiety, and suicidal ideation.      Objective:  BP Marland Kitchen)  138/96 (BP Location: Left Arm, Patient Position: Sitting, Cuff Size: Normal)   Pulse 84   Temp 97.7 F (36.5 C) (Oral)   Resp 15   Ht 5\' 6"  (1.676 m)   Wt 193 lb (87.5 kg)   LMP 09/28/2014 (Exact Date)   SpO2 98%   BMI 31.15 kg/m   Physical Exam  General Appearance:    Alert, cooperative, no distress, appears stated age  Head:    Normocephalic, without obvious abnormality, atraumatic  Eyes:    PERRL, conjunctiva/corneas clear, EOM's intact, fundi    benign, both eyes  Ears:    Normal TM's and external ear canals, both ears  Nose:   Nares normal, septum midline, mucosa normal, no drainage    or sinus tenderness  Throat:   Lips, mucosa, and tongue normal; teeth and gums normal  Neck:   Supple, symmetrical, trachea midline, no adenopathy;    thyroid:  no enlargement/tenderness/nodules; no  carotid   bruit or JVD  Back:     Symmetric, no curvature, ROM normal, no CVA tenderness  Lungs:     Clear to auscultation bilaterally, respirations unlabored  Chest Wall:    No tenderness or deformity   Heart:    Regular rate and rhythm, S1 and S2 normal, no murmur, rub   or gallop  Breast Exam:    No tenderness, masses, or nipple abnormality  Abdomen:     Soft, non-tender, bowel sounds active all four quadrants,    no masses, no organomegaly  Genitalia:    Pelvic: cervix normal in appearance, external genitalia normal, no adnexal masses or tenderness, no cervical motion tenderness, rectovaginal septum normal, uterus normal size, shape, and consistency and vagina normal without discharge  Extremities:   Extremities normal, atraumatic, no cyanosis or edema  Pulses:   2+ and symmetric all extremities  Skin:   Skin color, texture, turgor normal, no rashes or lesions  Lymph nodes:   Cervical, supraclavicular, and axillary nodes normal  Neurologic:   CNII-XII intact, normal strength, sensation and reflexes    throughout     Assessment & Plan:   Problem List Items Addressed This Visit    Essential hypertension    Given the elevation in home readings as well,  Will continue metoprolol for SVT and add telmisartan 20 mg  daily   Lab Results  Component Value Date   CREATININE 0.73 12/28/2017   Lab Results  Component Value Date   MICROALBUR <0.7 07/15/2017         Relevant Medications   telmisartan (MICARDIS) 20 MG tablet   Other Relevant Orders   Comprehensive metabolic panel (Completed)   Obesity (BMI 30-39.9)    I have encouraged  Continued weight loss with goal of 10% of body weight over the next 6 months using a low glycemic index diet and regular exercise a minimum of 5 days per week.        Prediabetes    Her A1c improved with low glycemic index diet and  weight loss   Lab Results  Component Value Date   HGBA1C 5.8 12/28/2017         Relevant Orders   Hemoglobin  A1c (Completed)   Generalized anxiety disorder    Managed with lexapro and rare prn use of alprazolam.  The risks and benefits of benzodiazepine use were  Reviewed with patient today including excessive sedation leading to respiratory depression,  impaired thinking/driving, and addiction.  Patient was advised to avoid concurrent use with alcohol,  to use medication only as needed and not to share with others  .       Encounter for preventive health examination    age appropriate education and counseling updated, referrals for preventative services and immunizations addressed, dietary and smoking counseling addressed, most recent labs reviewed.  I have personally reviewed and have noted:  1) the patient's medical and social history 2) The pt's use of alcohol, tobacco, and illicit drugs 3) The patient's current medications and supplements 4) Functional ability including ADL's, fall risk, home safety risk, hearing and visual impairment 5) Diet and physical activities 6) Evidence for depression or mood disorder 7) The patient's height, weight, and BMI have been recorded in the chart  PAP smear done  Cytology sent to rule out infection Breast exam done and mammogram ordered  I have made referrals, and provided counseling and education based on review of the above      Constipation    Aggravated by MS>  Adding Fleet's enema prn       Right anterior shoulder pain    Pain is in the proximal /anterior deltoid and biceps area  And has not improved with therapy from chiropractor .  Recommend seeing Poggi       Vaginal odor    Perceived by patient and husband  Exam is normal.  Cultures sent,  Recommending use of probiotics and avoidance of douching .       Other Visit Diagnoses    Cervical cancer screening    -  Primary   Relevant Orders   Cytology - PAP   Chronic vaginitis       Relevant Orders   Cytology - PAP   Breast cancer screening       Relevant Orders   MM 3D SCREEN BREAST  BILATERAL      I am having Oviya L. Baldus start on telmisartan. I am also having her maintain her oxybutynin, Vitamin D3, multivitamin, REBIF, Cranberry, Magnesium, escitalopram, metoprolol succinate, ALPRAZolam, metoprolol succinate, and psyllium.  Meds ordered this encounter  Medications  . telmisartan (MICARDIS) 20 MG tablet    Sig: Take 1 tablet (20 mg total) by mouth daily.    Dispense:  30 tablet    Refill:  1    There are no discontinued medications.  Follow-up: Return in about 6 months (around 06/30/2018).   Sherlene Shams, MD

## 2017-12-29 DIAGNOSIS — M25511 Pain in right shoulder: Secondary | ICD-10-CM | POA: Insufficient documentation

## 2017-12-29 DIAGNOSIS — N898 Other specified noninflammatory disorders of vagina: Secondary | ICD-10-CM | POA: Insufficient documentation

## 2017-12-29 LAB — CYTOLOGY - PAP
Bacterial vaginitis: NEGATIVE
CHLAMYDIA, DNA PROBE: NEGATIVE
Candida vaginitis: NEGATIVE
Diagnosis: NEGATIVE
HPV (WINDOPATH): NOT DETECTED
Neisseria Gonorrhea: NEGATIVE
TRICH (WINDOWPATH): NEGATIVE

## 2017-12-29 NOTE — Assessment & Plan Note (Signed)
Aggravated by MS>  Adding Fleet's enema prn

## 2017-12-29 NOTE — Assessment & Plan Note (Signed)
Pain is in the proximal /anterior deltoid and biceps area  And has not improved with therapy from chiropractor .  Recommend seeing Poggi

## 2017-12-29 NOTE — Assessment & Plan Note (Signed)
Given the elevation in home readings as well,  Will continue metoprolol for SVT and add telmisartan 20 mg  daily   Lab Results  Component Value Date   CREATININE 0.73 12/28/2017   Lab Results  Component Value Date   MICROALBUR <0.7 07/15/2017

## 2017-12-29 NOTE — Assessment & Plan Note (Signed)
Managed with lexapro and rare prn use of alprazolam.  The risks and benefits of benzodiazepine use were  Reviewed with patient today including excessive sedation leading to respiratory depression,  impaired thinking/driving, and addiction.  Patient was advised to avoid concurrent use with alcohol, to use medication only as needed and not to share with others  .  

## 2017-12-29 NOTE — Assessment & Plan Note (Signed)
age appropriate education and counseling updated, referrals for preventative services and immunizations addressed, dietary and smoking counseling addressed, most recent labs reviewed.  I have personally reviewed and have noted:  1) the patient's medical and social history 2) The pt's use of alcohol, tobacco, and illicit drugs 3) The patient's current medications and supplements 4) Functional ability including ADL's, fall risk, home safety risk, hearing and visual impairment 5) Diet and physical activities 6) Evidence for depression or mood disorder 7) The patient's height, weight, and BMI have been recorded in the chart  PAP smear done  Cytology sent to rule out infection Breast exam done and mammogram ordered  I have made referrals, and provided counseling and education based on review of the above

## 2017-12-29 NOTE — Assessment & Plan Note (Signed)
Her A1c improved with low glycemic index diet and  weight loss   Lab Results  Component Value Date   HGBA1C 5.8 12/28/2017

## 2017-12-29 NOTE — Assessment & Plan Note (Signed)
I have  encouraged  Continued weight loss with goal of 10% of body weight over the next 6 months using a low glycemic index diet and regular exercise a minimum of 5 days per week.   

## 2017-12-29 NOTE — Assessment & Plan Note (Signed)
Perceived by patient and husband  Exam is normal.  Cultures sent,  Recommending use of probiotics and avoidance of douching .

## 2018-01-23 ENCOUNTER — Telehealth: Payer: Self-pay

## 2018-01-23 NOTE — Telephone Encounter (Signed)
Copied from CRM [id number]. Topic: Appointment Scheduling - Scheduling Inquiry for Clinic >> Jan 23, 2018  9:01 AM Jolayne Haines L wrote: Reason for CRM: Patient states when she was in on 8/21 she mentioned to Dr Darrick Huntsman that she was ready to set up her mammogram. She thought someone would be in contact with, she has not heard from anyone. She would like to go to [facility].

## 2018-01-23 NOTE — Telephone Encounter (Signed)
LMTCB. Please transfer pt to our office.  

## 2018-01-24 ENCOUNTER — Telehealth: Payer: Self-pay | Admitting: Internal Medicine

## 2018-01-24 DIAGNOSIS — Z0279 Encounter for issue of other medical certificate: Secondary | ICD-10-CM

## 2018-01-24 NOTE — Telephone Encounter (Signed)
Pt called back returned call. Pt wanted to know the number to [facility] and if she needed a referral to get her Mammo done. I advised to pt that she doesnot need a referral and she can call [place] to sch pt was given the number to call.

## 2018-01-24 NOTE — Telephone Encounter (Signed)
Pt dropped off CPE form to be filled out. Placed in Dr. Darci Needle color folder upfront  Please fax to [phone] when completed

## 2018-01-26 NOTE — Telephone Encounter (Signed)
Placed in quick sign folder.  

## 2018-01-27 NOTE — Telephone Encounter (Signed)
Signed and returned

## 2018-01-27 NOTE — Telephone Encounter (Signed)
Form has been faxed.

## 2018-02-20 ENCOUNTER — Other Ambulatory Visit: Payer: Self-pay | Admitting: Internal Medicine

## 2018-02-23 ENCOUNTER — Telehealth: Payer: Self-pay | Admitting: Internal Medicine

## 2018-02-23 NOTE — Telephone Encounter (Signed)
The patient's information has been sent to charge correction to have the fee removed as a courtesy.

## 2018-02-23 NOTE — Telephone Encounter (Signed)
Copied from CRM [id number]. Topic: General - Other >> Feb 21, 2018  8:47 AM Debroah Loop wrote: Reason for CRM: see CRM from 02/17/2018. patient is calling about a bill she received from 01/24/18 and the bill was $29. She states she dropped a form off to be completed for her insurance and she was not told that she would get billed for this. She states her husband did the same thing and was not charged. She is requesting a call back.

## 2018-03-07 ENCOUNTER — Ambulatory Visit
Admission: RE | Admit: 2018-03-07 | Discharge: 2018-03-07 | Disposition: A | Discharge status: Home / Self Care | Payer: Managed Care, Other (non HMO) | Source: Ambulatory Visit | Attending: Internal Medicine | Admitting: Internal Medicine

## 2018-03-07 DIAGNOSIS — Z1239 Encounter for other screening for malignant neoplasm of breast: Secondary | ICD-10-CM | POA: Insufficient documentation

## 2018-05-10 ENCOUNTER — Other Ambulatory Visit: Payer: Self-pay | Admitting: Internal Medicine

## 2018-05-29 ENCOUNTER — Other Ambulatory Visit: Payer: Self-pay

## 2018-05-29 DIAGNOSIS — F5101 Primary insomnia: Secondary | ICD-10-CM

## 2018-05-29 NOTE — Telephone Encounter (Signed)
wHat medication?

## 2018-05-29 NOTE — Addendum Note (Signed)
Addended by: Sandy Salaam on: 05/29/2018 05:44 PM   Modules accepted: Orders

## 2018-05-29 NOTE — Telephone Encounter (Signed)
Refilled: 08/23/2017 Last OV: 12/28/2017 Next OV: 07/21/2018

## 2018-05-30 ENCOUNTER — Telehealth: Payer: Self-pay | Admitting: Internal Medicine

## 2018-05-30 ENCOUNTER — Other Ambulatory Visit: Payer: Self-pay | Admitting: Internal Medicine

## 2018-05-30 DIAGNOSIS — F5101 Primary insomnia: Secondary | ICD-10-CM

## 2018-05-30 MED ORDER — ALPRAZOLAM 0.5 MG PO TABS: 1.0000 mg | ORAL_TABLET | Freq: Two times a day (BID) | ORAL | 2 refills | Status: DC | PRN

## 2018-05-30 NOTE — Telephone Encounter (Signed)
Please confirm ASAP  that the alprazolam is to be sent to mail order,  I will ony fill for one month at a time.

## 2018-05-31 MED ORDER — ALPRAZOLAM 0.5 MG PO TABS: 1.0000 mg | ORAL_TABLET | Freq: Two times a day (BID) | ORAL | 2 refills | Status: DC | PRN

## 2018-05-31 NOTE — Telephone Encounter (Signed)
Patient would like medication to be sent to [facility] on [facility] [address] [facility]

## 2018-05-31 NOTE — Telephone Encounter (Signed)
done

## 2018-05-31 NOTE — Telephone Encounter (Signed)
Pt aware.

## 2018-05-31 NOTE — Telephone Encounter (Signed)
Patient is requesting this be sent to  [facility] #[id number] [place], [place] - [address] AT [facility] OF [address] [phone] (Phone) [phone] (Fax)

## 2018-05-31 NOTE — Telephone Encounter (Signed)
LMTCB. PEC may speak with pt.  

## 2018-06-01 ENCOUNTER — Ambulatory Visit: Payer: Managed Care, Other (non HMO) | Admitting: Internal Medicine

## 2018-07-21 ENCOUNTER — Encounter: Payer: Self-pay | Admitting: Internal Medicine

## 2018-07-21 ENCOUNTER — Other Ambulatory Visit: Payer: Self-pay

## 2018-07-21 ENCOUNTER — Ambulatory Visit (INDEPENDENT_AMBULATORY_CARE_PROVIDER_SITE_OTHER): Payer: Managed Care, Other (non HMO) | Admitting: Internal Medicine

## 2018-07-21 VITALS — BP 120/90 | HR 76 | Temp 97.6°F | Wt 186.6 lb

## 2018-07-21 DIAGNOSIS — E669 Obesity, unspecified: Secondary | ICD-10-CM

## 2018-07-21 DIAGNOSIS — I10 Essential (primary) hypertension: Secondary | ICD-10-CM

## 2018-07-21 DIAGNOSIS — R7303 Prediabetes: Secondary | ICD-10-CM | POA: Diagnosis not present

## 2018-07-21 DIAGNOSIS — F411 Generalized anxiety disorder: Secondary | ICD-10-CM | POA: Diagnosis not present

## 2018-07-21 DIAGNOSIS — E785 Hyperlipidemia, unspecified: Secondary | ICD-10-CM | POA: Diagnosis not present

## 2018-07-21 DIAGNOSIS — I872 Venous insufficiency (chronic) (peripheral): Secondary | ICD-10-CM

## 2018-07-21 LAB — LIPID PANEL
Cholesterol: 216 mg/dL — ABNORMAL HIGH (ref 0–200)
HDL: 67.7 mg/dL (ref >39.00)
LDL Cholesterol: 127 mg/dL — ABNORMAL HIGH (ref 0–99)
NonHDL: 148.59
Total CHOL/HDL Ratio: 3
Triglycerides: 106 mg/dL (ref 0.0–149.0)
VLDL: 21.2 mg/dL (ref 0.0–40.0)

## 2018-07-21 LAB — COMPREHENSIVE METABOLIC PANEL
ALT: 15 U/L (ref 0–35)
AST: 18 U/L (ref 0–37)
Albumin: 4.4 g/dL (ref 3.5–5.2)
Alkaline Phosphatase: 50 U/L (ref 39–117)
BUN: 19 mg/dL (ref 6–23)
CO2: 30 mEq/L (ref 19–32)
Calcium: 9.5 mg/dL (ref 8.4–10.5)
Chloride: 101 mEq/L (ref 96–112)
Creatinine, Ser: 0.72 mg/dL (ref 0.40–1.20)
GFR: 84.25 mL/min (ref >60.00)
Glucose, Bld: 83 mg/dL (ref 70–99)
Potassium: 4.6 mEq/L (ref 3.5–5.1)
Sodium: 138 mEq/L (ref 135–145)
Total Bilirubin: 0.4 mg/dL (ref 0.2–1.2)
Total Protein: 7.1 g/dL (ref 6.0–8.3)

## 2018-07-21 LAB — HEMOGLOBIN A1C: Hgb A1c MFr Bld: 6 % (ref 4.6–6.5)

## 2018-07-21 NOTE — Progress Notes (Signed)
Subjective:  Patient ID: Natalie Pugh, female    DOB: Dec 12, 1965  Age: 53 y.o. MRN: [id number]  CC: The primary encounter diagnosis was Prediabetes. Diagnoses of Hyperlipidemia LDL goal <130, Venous insufficiency of both lower extremities, Generalized anxiety disorder, Essential hypertension, and Obesity (BMI 30-39.9) were also pertinent to this visit.  HPI Natalie Pugh presents for 6 month follow up on hypertension, prediabetes, obesity complicated by multiple sclerosis ,  And GAD   Obeisty;  Goal is 170  , home best is 178 .  Vigorous exercise is limited by her MS.   Diet reviewed Using stevia erythritol mix.  Stopped drinking diet coke   HTN:  Home readings are still > 70 diastolic but systolics are in the 120's.  Walking daily and doing some light weights.  GAD:  Well controlled with lexapro and prn alprazolam.  The risks and benefits of  Chronic  benzodiazepine use were discussed with patient today including increased risk of dementia,  Addiction, and seizures if abruptly withdrawn  .      Outpatient Medications Prior to Visit  Medication Sig Dispense Refill  . ALPRAZolam (XANAX) 0.5 MG tablet Take 2 tablets (1 mg total) by mouth 2 (two) times daily as needed for anxiety or sleep. 60 tablet 2  . Cholecalciferol (VITAMIN D3) 1000 UNITS CAPS Take 2 capsules by mouth daily.     . Cranberry 250 MG CAPS Take 2 capsules by mouth daily.    Marland Kitchen escitalopram (LEXAPRO) 10 MG tablet TAKE 1 TABLET BY MOUTH DAILY 90 tablet 0  . Magnesium 200 MG TABS Take 1 tablet by mouth daily.    . metoprolol succinate (TOPROL-XL) 50 MG 24 hr tablet TAKE 1 TABLET(50 MG) BY MOUTH DAILY 90 tablet 0  . metoprolol succinate (TOPROL-XL) 50 MG 24 hr tablet TAKE 1 TABLET(50 MG) BY MOUTH DAILY 90 tablet 1  . Multiple Vitamin (MULTIVITAMIN) tablet Take 1 tablet by mouth daily.    Marland Kitchen oxybutynin (DITROPAN-XL) 5 MG 24 hr tablet Take 5 mg by mouth daily as needed.     . psyllium (REGULOID) 0.52 g capsule Take 0.52 g  by mouth daily.    Marland Kitchen REBIF 44 MCG/0.5ML injection INJECT 1 PREFILLED SYRINGE SUBCUTANEOUSLY 3 TIMES PER WEEK AS DIRECTED 18 mL 0  . telmisartan (MICARDIS) 20 MG tablet TAKE 1 TABLET(20 MG) BY MOUTH DAILY 90 tablet 1   No facility-administered medications prior to visit.     Review of Systems;  Patient denies headache, fevers, malaise, unintentional weight loss, skin rash, eye pain, sinus congestion and sinus pain, sore throat, dysphagia,  hemoptysis , cough, dyspnea, wheezing, chest pain, palpitations, orthopnea, edema, abdominal pain, nausea, melena, diarrhea, constipation, flank pain, dysuria, hematuria, urinary  Frequency, nocturia, numbness, tingling, seizures,  Focal weakness, Loss of consciousness,  Tremor, insomnia, depression, anxiety, and suicidal ideation.      Objective:  BP 120/90   Pulse 76   Temp 97.6 F (36.4 C) (Oral)   Wt 186 lb 9.6 oz (84.6 kg)   LMP 09/28/2014 (Exact Date)   SpO2 98%   BMI 30.12 kg/m   BP Readings from Last 3 Encounters:  07/21/18 120/90  12/28/17 (!) 138/96  07/15/17 138/80    Wt Readings from Last 3 Encounters:  07/21/18 186 lb 9.6 oz (84.6 kg)  12/28/17 193 lb (87.5 kg)  07/15/17 201 lb 6.4 oz (91.4 kg)    General appearance: alert, cooperative and appears stated age Ears: normal TM's and external ear canals both  ears Throat: lips, mucosa, and tongue normal; teeth and gums normal Neck: no adenopathy, no carotid bruit, supple, symmetrical, trachea midline and thyroid not enlarged, symmetric, no tenderness/mass/nodules Back: symmetric, no curvature. ROM normal. No CVA tenderness. Lungs: clear to auscultation bilaterally Heart: regular rate and rhythm, S1, S2 normal, no murmur, click, rub or gallop Abdomen: soft, non-tender; bowel sounds normal; no masses,  no organomegaly Pulses: 2+ and symmetric Skin: Skin color, texture, turgor normal. No rashes or lesions Lymph nodes: Cervical, supraclavicular, and axillary nodes normal.  Lab  Results  Component Value Date   HGBA1C 6.0 07/21/2018   HGBA1C 5.8 12/28/2017   HGBA1C 6.0 07/13/2017    Lab Results  Component Value Date   CREATININE 0.72 07/21/2018   CREATININE 0.73 12/28/2017   CREATININE 0.71 07/13/2017    Lab Results  Component Value Date   WBC 3.7 (L) 03/25/2017   HGB 12.4 03/25/2017   HCT 37.7 03/25/2017   PLT 227.0 03/25/2017   GLUCOSE 83 07/21/2018   CHOL 216 (H) 07/21/2018   TRIG 106.0 07/21/2018   HDL 67.70 07/21/2018   LDLDIRECT 107.0 03/25/2017   LDLCALC 127 (H) 07/21/2018   ALT 15 07/21/2018   AST 18 07/21/2018   NA 138 07/21/2018   K 4.6 07/21/2018   CL 101 07/21/2018   CREATININE 0.72 07/21/2018   BUN 19 07/21/2018   CO2 30 07/21/2018   TSH 2.12 03/25/2017   HGBA1C 6.0 07/21/2018   MICROALBUR <0.7 07/15/2017    Mm 3d Screen Breast Bilateral  Result Date: 03/07/2018 CLINICAL DATA:  Screening. EXAM: DIGITAL SCREENING BILATERAL MAMMOGRAM WITH TOMO AND CAD COMPARISON:  Previous exam(s). ACR Breast Density Category b: There are scattered areas of fibroglandular density. FINDINGS: There are no findings suspicious for malignancy. Images were processed with CAD. IMPRESSION: No mammographic evidence of malignancy. A result letter of this screening mammogram will be mailed directly to the patient. RECOMMENDATION: Screening mammogram in one year. (Code:SM-B-01Y) BI-RADS CATEGORY  1: Negative. Electronically Signed   By: Britta Mccreedy M.D.   On: 03/07/2018 16:57    Assessment & Plan:   Problem List Items Addressed This Visit    Venous insufficiency of both lower extremities    Reassured that the purplish color to her toes with sitting is  gravity dependent VI. encouraged to use compression knee highs and walk /exercise calf muscles daily       Prediabetes - Primary    Her A1c remains in the prediabetic range with low glycemic index diet and  weight loss  . Encouraged to increase exercise.  Lab Results  Component Value Date   HGBA1C 6.0  07/21/2018         Relevant Orders   Hemoglobin A1c (Completed)   Comprehensive metabolic panel (Completed)   Obesity (BMI 30-39.9)    I have congratulated her in reduction of   BMI and encouraged  Continued weight loss with goal of 10% of body weigh over the next 6 months using a low glycemic index diet and regular exercise a minimum of 5 days per week.        Generalized anxiety disorder    Managed with lexapro and rare prn use of alprazolam.  The risks and benefits of benzodiazepine use were  Reviewed with patient today including excessive sedation leading to respiratory depression,  impaired thinking/driving, and addiction.  Patient was advised to avoid concurrent use with alcohol, to use medication only as needed and not to share with others  .  Essential hypertension    Diastolic elevations should improve with exercise,  But may need to add 2.5 mg amlodipine       Other Visit Diagnoses    Hyperlipidemia LDL goal <130       Relevant Orders   Lipid panel (Completed)      I am having Bailynn L. Sandefur maintain her oxybutynin, Vitamin D3, multivitamin, Rebif, Cranberry, Magnesium, metoprolol succinate, metoprolol succinate, psyllium, telmisartan, escitalopram, and ALPRAZolam.  No orders of the defined types were placed in this encounter.   There are no discontinued medications.  Follow-up: Return in about 6 months (around 01/21/2019).   Sherlene Shams, MD

## 2018-07-21 NOTE — Patient Instructions (Signed)
Let's give your BP one more month to improve with cycling  If your diastolic is not < 80 by mid April,  We will add 2.5 mg amlodipine

## 2018-07-23 NOTE — Assessment & Plan Note (Signed)
Diastolic elevations should improve with exercise,  But may need to add 2.5 mg amlodipine

## 2018-07-23 NOTE — Assessment & Plan Note (Signed)
Her A1c remains in the prediabetic range with low glycemic index diet and  weight loss  . Encouraged to increase exercise.  Lab Results  Component Value Date   HGBA1C 6.0 07/21/2018

## 2018-07-23 NOTE — Assessment & Plan Note (Signed)
Managed with lexapro and rare prn use of alprazolam.  The risks and benefits of benzodiazepine use were  Reviewed with patient today including excessive sedation leading to respiratory depression,  impaired thinking/driving, and addiction.  Patient was advised to avoid concurrent use with alcohol, to use medication only as needed and not to share with others  .  

## 2018-07-23 NOTE — Assessment & Plan Note (Signed)
I have congratulated her in reduction of   BMI and encouraged  Continued weight loss with goal of 10% of body weigh over the next 6 months using a low glycemic index diet and regular exercise a minimum of 5 days per week.    

## 2018-07-23 NOTE — Assessment & Plan Note (Signed)
Reassured that the purplish color to her toes with sitting is  gravity dependent VI. encouraged to use compression knee highs and walk /exercise calf muscles daily

## 2018-08-04 ENCOUNTER — Other Ambulatory Visit: Payer: Self-pay | Admitting: Internal Medicine

## 2018-08-05 ENCOUNTER — Other Ambulatory Visit: Payer: Self-pay | Admitting: Internal Medicine

## 2018-08-07 ENCOUNTER — Other Ambulatory Visit: Payer: Self-pay | Admitting: Internal Medicine

## 2018-11-21 ENCOUNTER — Telehealth: Payer: Self-pay

## 2018-11-21 DIAGNOSIS — F5101 Primary insomnia: Secondary | ICD-10-CM

## 2018-11-21 MED ORDER — ALPRAZOLAM 0.5 MG PO TABS: 1.0000 mg | ORAL_TABLET | Freq: Two times a day (BID) | ORAL | 2 refills | Status: DC | PRN

## 2018-11-21 NOTE — Telephone Encounter (Signed)
Refilled: 05/31/2018 Last OV: 07/21/2018 Next OV: 12/29/2018

## 2018-11-21 NOTE — Telephone Encounter (Signed)
Which med?

## 2018-11-21 NOTE — Telephone Encounter (Signed)
Sorry the alprazolam

## 2018-11-21 NOTE — Addendum Note (Signed)
Addended by: Crecencio Mc on: 11/21/2018 01:03 PM   Modules accepted: Orders

## 2018-12-29 ENCOUNTER — Ambulatory Visit: Payer: Managed Care, Other (non HMO) | Admitting: Internal Medicine

## 2019-01-05 ENCOUNTER — Other Ambulatory Visit: Payer: Self-pay | Admitting: Neurology

## 2019-01-05 DIAGNOSIS — G35 Multiple sclerosis: Secondary | ICD-10-CM

## 2019-01-17 ENCOUNTER — Other Ambulatory Visit: Payer: Self-pay

## 2019-01-25 ENCOUNTER — Other Ambulatory Visit: Payer: Self-pay

## 2019-01-25 MED ORDER — ESCITALOPRAM OXALATE 10 MG PO TABS: 10.0000 mg | ORAL_TABLET | Freq: Every day | ORAL | 1 refills | Status: DC

## 2019-01-25 MED ORDER — TELMISARTAN 20 MG PO TABS: ORAL_TABLET | ORAL | 1 refills | Status: DC

## 2019-01-25 MED ORDER — METOPROLOL SUCCINATE ER 50 MG PO TB24: ORAL_TABLET | ORAL | 1 refills | Status: DC

## 2019-02-07 ENCOUNTER — Other Ambulatory Visit: Payer: Self-pay

## 2019-02-07 DIAGNOSIS — Z20822 Contact with and (suspected) exposure to covid-19: Secondary | ICD-10-CM

## 2019-02-08 LAB — NOVEL CORONAVIRUS, NAA: SARS-CoV-2, NAA: NOT DETECTED

## 2019-02-09 ENCOUNTER — Ambulatory Visit: Payer: Self-pay | Admitting: Internal Medicine

## 2019-03-06 ENCOUNTER — Other Ambulatory Visit: Payer: Self-pay

## 2019-03-08 ENCOUNTER — Encounter: Payer: Self-pay | Admitting: Internal Medicine

## 2019-03-08 ENCOUNTER — Ambulatory Visit (INDEPENDENT_AMBULATORY_CARE_PROVIDER_SITE_OTHER): Payer: Managed Care, Other (non HMO) | Admitting: Internal Medicine

## 2019-03-08 ENCOUNTER — Other Ambulatory Visit: Payer: Self-pay

## 2019-03-08 VITALS — BP 140/94 | HR 82 | Temp 97.3°F | Resp 15 | Ht 66.0 in | Wt 197.6 lb

## 2019-03-08 DIAGNOSIS — G35 Multiple sclerosis: Secondary | ICD-10-CM

## 2019-03-08 DIAGNOSIS — Z23 Encounter for immunization: Secondary | ICD-10-CM | POA: Diagnosis not present

## 2019-03-08 DIAGNOSIS — I10 Essential (primary) hypertension: Secondary | ICD-10-CM | POA: Diagnosis not present

## 2019-03-08 DIAGNOSIS — R7303 Prediabetes: Secondary | ICD-10-CM

## 2019-03-08 DIAGNOSIS — R5383 Other fatigue: Secondary | ICD-10-CM

## 2019-03-08 DIAGNOSIS — Z0000 Encounter for general adult medical examination without abnormal findings: Secondary | ICD-10-CM

## 2019-03-08 LAB — LIPID PANEL
Cholesterol: 198 mg/dL (ref 0–200)
HDL: 65.8 mg/dL (ref >39.00)
LDL Cholesterol: 113 mg/dL — ABNORMAL HIGH (ref 0–99)
NonHDL: 132.64
Total CHOL/HDL Ratio: 3
Triglycerides: 99 mg/dL (ref 0.0–149.0)
VLDL: 19.8 mg/dL (ref 0.0–40.0)

## 2019-03-08 LAB — COMPREHENSIVE METABOLIC PANEL
ALT: 22 U/L (ref 0–35)
AST: 22 U/L (ref 0–37)
Albumin: 4.4 g/dL (ref 3.5–5.2)
Alkaline Phosphatase: 43 U/L (ref 39–117)
BUN: 14 mg/dL (ref 6–23)
CO2: 28 mEq/L (ref 19–32)
Calcium: 9.6 mg/dL (ref 8.4–10.5)
Chloride: 98 mEq/L (ref 96–112)
Creatinine, Ser: 0.65 mg/dL (ref 0.40–1.20)
GFR: 94.59 mL/min (ref >60.00)
Glucose, Bld: 84 mg/dL (ref 70–99)
Potassium: 4.3 mEq/L (ref 3.5–5.1)
Sodium: 132 mEq/L — ABNORMAL LOW (ref 135–145)
Total Bilirubin: 0.5 mg/dL (ref 0.2–1.2)
Total Protein: 7.4 g/dL (ref 6.0–8.3)

## 2019-03-08 LAB — MICROALBUMIN / CREATININE URINE RATIO
Creatinine,U: 42.8 mg/dL
Microalb Creat Ratio: 1.6 mg/g (ref 0.0–30.0)
Microalb, Ur: <0.7 mg/dL (ref 0.0–1.9)

## 2019-03-08 LAB — HEMOGLOBIN A1C: Hgb A1c MFr Bld: 5.9 % (ref 4.6–6.5)

## 2019-03-08 MED ORDER — OXYBUTYNIN CHLORIDE ER 5 MG PO TB24: 5.0000 mg | ORAL_TABLET | Freq: Every day | ORAL | 1 refills | Status: DC | PRN

## 2019-03-08 NOTE — Progress Notes (Signed)
Patient ID: Natalie Pugh, female    DOB: Nov 19, 1965  Age: 53 y.o. MRN: [id number]  The patient is here for annual physical  examination and management of other chronic and acute problems.   The risk factors are reflected in the social history.  The roster of all physicians providing medical care to patient - is listed in the Snapshot section of the chart.  Activities of daily living:  The patient is 100% independent in all ADLs: dressing, toileting, feeding as well as independent mobility  Home safety : The patient has smoke detectors in the home. They wear seatbelts.  There are no firearms at home. There is no violence in the home.   There is no risks for hepatitis, STDs or HIV. There is no   history of blood transfusion. They have no travel history to infectious disease endemic areas of the world.  The patient has seen their dentist in the last six month. They have seen their eye doctor in the last year. They admit to slight hearing difficulty with regard to whispered voices and some television programs.  They have deferred audiologic testing in the last year.  They do not  have excessive sun exposure. Discussed the need for sun protection: hats, long sleeves and use of sunscreen if there is significant sun exposure.   Diet: the importance of a healthy diet is discussed. They do have a healthy diet.  The benefits of regular aerobic exercise were discussed. She walks 4 times per week ,  20 minutes.   Depression screen: there are no signs or vegative symptoms of depression- irritability, change in appetite, anhedonia, sadness/tearfullness.  Cognitive assessment: the patient manages all their financial and personal affairs and is actively engaged. They could relate day,date,year and events; recalled 2/3 objects at 3 minutes; performed clock-face test normally.  The following portions of the patient's history were reviewed and updated as appropriate: allergies, current medications, past  family history, past medical history,  past surgical history, past social history  and problem list.  Visual acuity was not assessed per patient preference since she has regular follow up with her ophthalmologist. Hearing and body mass index were assessed and reviewed.   During the course of the visit the patient was educated and counseled about appropriate screening and preventive services including : fall prevention , diabetes screening, nutrition counseling, colorectal cancer screening, and recommended immunizations.    CC: The primary encounter diagnosis was Encounter for preventive health examination. Diagnoses of Fatigue, unspecified type, Prediabetes, Essential hypertension, Need for immunization against influenza, and Multiple sclerosis, relapsing-remitting (HCC) were also pertinent to this visit.  Hypertension: patient checks blood pressure twice weekly at home.  Readings have been for the most part < 130/80 at rest . Patient is following a reduce salt diet most days and is taking medications as prescribed   Prediabetes:  The patient feels generally well, is walking several times per week for exercise and weighing weekly .  Following a carbohydrate modified diet  3 to 4 days per week but has gained weight due to inactivity (relative) and frequent poor choices.  She has gained 13 lbs , and has no symptoms suggestive of diabetes (polyuria, polyphagia, polydipsia).    History Morning has a past medical history of Anxiety, Diabetes mellitus, Hypertension, Multiple sclerosis, relapsing-remitting (HCC) (2001), Neurogenic bladder, and SVT (supraventricular tachycardia) (HCC).   She has a past surgical history that includes Colonoscopy with propofol (N/A, 01/29/2015) and Lung removal, partial (Left, 2016).   Her  family history includes Breast cancer (age of onset: 64) in her maternal aunt; COPD in her mother; Cancer (age of onset: 73) in her maternal aunt; Heart disease in her mother; Hyperlipidemia  in her paternal uncle.She reports that she quit smoking about 25 years ago. Her smoking use included cigarettes. She has never used smokeless tobacco. She reports current alcohol use. She reports that she does not use drugs.  Outpatient Medications Prior to Visit  Medication Sig Dispense Refill  . ALPRAZolam (XANAX) 0.5 MG tablet Take 2 tablets (1 mg total) by mouth 2 (two) times daily as needed for anxiety or sleep. 60 tablet 2  . Cholecalciferol (VITAMIN D3) 1000 UNITS CAPS Take 2 capsules by mouth daily.     . Cranberry 250 MG CAPS Take 2 capsules by mouth daily.    Marland Kitchen escitalopram (LEXAPRO) 10 MG tablet Take 1 tablet (10 mg total) by mouth daily. 90 tablet 1  . Magnesium 200 MG TABS Take 1 tablet by mouth daily.    . metoprolol succinate (TOPROL-XL) 50 MG 24 hr tablet TAKE 1 TABLET(50 MG) BY MOUTH DAILY 90 tablet 1  . Multiple Vitamin (MULTIVITAMIN) tablet Take 1 tablet by mouth daily.    Marland Kitchen REBIF 44 MCG/0.5ML injection INJECT 1 PREFILLED SYRINGE SUBCUTANEOUSLY 3 TIMES PER WEEK AS DIRECTED 18 mL 0  . telmisartan (MICARDIS) 20 MG tablet TAKE 1 TABLET(20 MG) BY MOUTH DAILY 90 tablet 1  . oxybutynin (DITROPAN-XL) 5 MG 24 hr tablet Take 5 mg by mouth daily as needed.     . psyllium (REGULOID) 0.52 g capsule Take 0.52 g by mouth daily.     No facility-administered medications prior to visit.     Review of Systems   Patient denies headache, fevers, malaise, unintentional weight loss, skin rash, eye pain, sinus congestion and sinus pain, sore throat, dysphagia,  hemoptysis , cough, dyspnea, wheezing, chest pain, palpitations, orthopnea, edema, abdominal pain, nausea, melena, diarrhea, constipation, flank pain, dysuria, hematuria, urinary  Frequency, nocturia, numbness, tingling, seizures,  Focal weakness, Loss of consciousness,  Tremor, insomnia, depression, anxiety, and suicidal ideation.      Objective:  BP (!) 140/94 (BP Location: Left Arm, Patient Position: Sitting, Cuff Size: Normal)    Pulse 82   Temp (!) 97.3 F (36.3 C) (Temporal)   Resp 15   Ht 5\' 6"  (1.676 m)   Wt 197 lb 9.6 oz (89.6 kg)   LMP 09/28/2014 (Exact Date)   SpO2 99%   BMI 31.89 kg/m   Physical Exam  General appearance: alert, cooperative and appears stated age Head: Normocephalic, without obvious abnormality, atraumatic Eyes: conjunctivae/corneas clear. PERRL, EOM's intact. Fundi benign. Ears: normal TM's and external ear canals both ears Nose: Nares normal. Septum midline. Mucosa normal. No drainage or sinus tenderness. Throat: lips, mucosa, and tongue normal; teeth and gums normal Neck: no adenopathy, no carotid bruit, no JVD, supple, symmetrical, trachea midline and thyroid not enlarged, symmetric, no tenderness/mass/nodules Lungs: clear to auscultation bilaterally Breasts: normal appearance, no masses or tenderness Heart: regular rate and rhythm, S1, S2 normal, no murmur, click, rub or gallop Abdomen: soft, non-tender; bowel sounds normal; no masses,  no organomegaly Extremities: extremities normal, atraumatic, no cyanosis or edema Pulses: 2+ and symmetric Skin: Skin color, texture, turgor normal. No rashes or lesions Neurologic: Alert and oriented X 3, normal strength and tone. Normal symmetric reflexes. Normal coordination and gait.     Assessment & Plan:   Problem List Items Addressed This Visit  Unprioritized   Multiple sclerosis, relapsing-remitting (HCC)    She continues to follow up with  neurologist Dr Sherryll BurgerShah regularly to address primary symptoms which include poor balance, but has not had any recent falls. 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        Essential hypertension    BP is elevated despite taking metoprolol and telmisartan.  Advised to check home readings and submit       Relevant Orders   Microalbumin / creatinine urine ratio (Completed)   Prediabetes    a1c is stable.  Weight gain addressed. 15 minutes was spent with patient  In reviewing  her diet and exercise regimen, providing  counselling about the the role of diet and exercise  In preventing progression from prediabetes to diabetes,and making suggestions on how to modify her current lifestyle.         Relevant Orders   Comprehensive metabolic panel (Completed)   Lipid panel (Completed)   Hemoglobin A1c (Completed)   Encounter for preventive health examination - Primary    age appropriate education and counseling updated, referrals for preventative services and immunizations addressed, dietary and smoking counseling addressed, most recent labs reviewed.  I have personally reviewed and have noted:  1) the patient's medical and social history 2) The pt's use of alcohol, tobacco, and illicit drugs 3) The patient's current medications and supplements 4) Functional ability including ADL's, fall risk, home safety risk, hearing and visual impairment 5) Diet and physical activities 6) Evidence for depression or mood disorder 7) The patient's height, weight, and BMI have been recorded in the chart  I have made referrals, and provided counseling and education based on review of the above      Fatigue    Screening labs normal.  No history of snoring.  Recommended participating in regular exercise program with goal of achieving a minimum of 30 minutes of aerobic activity 5 days per week.   Lab Results  Component Value Date   CREATININE 0.79 06/09/2015   Lab Results  Component Value Date   ALT 14 06/09/2015   AST 18 06/09/2015   ALKPHOS 69 06/09/2015   BILITOT 0.6 06/09/2015   Lab Results  Component Value Date   TSH 1.68 06/09/2015   Lab Results  Component Value Date   WBC 8.6 06/09/2015   HGB 14.7 06/09/2015   HCT 43.8 06/09/2015   MCV 92.7 06/09/2015   PLT 248.0 06/09/2015        Relevant Orders   TSH (Completed)   Comprehensive metabolic panel (Completed)    Other Visit Diagnoses    Need for immunization against influenza       Relevant Orders   Flu  Vaccine QUAD 36+ mos IM (Completed)      I have discontinued Whitni L. Raker's psyllium. I have also changed her oxybutynin. Additionally, I am having her maintain her Vitamin D3, multivitamin, Rebif, Cranberry, Magnesium, ALPRAZolam, telmisartan, metoprolol succinate, and escitalopram.  Meds ordered this encounter  Medications  . oxybutynin (DITROPAN-XL) 5 MG 24 hr tablet    Sig: Take 1 tablet (5 mg total) by mouth daily as needed.    Dispense:  90 tablet    Refill:  1    Medications Discontinued During This Encounter  Medication Reason  . psyllium (REGULOID) 0.52 g capsule Patient has not taken in last 30 days  . oxybutynin (DITROPAN-XL) 5 MG 24 hr tablet Reorder    Follow-up: No follow-ups on file.  Crecencio Mc, MD

## 2019-03-08 NOTE — Patient Instructions (Signed)
Stouffer's cauliflower pizza bowl  Is 13 net carbs  Pumpkin seeds!  30 minutes of aerobic exercise is KEY    Health Maintenance for Postmenopausal Women Menopause is a normal process in which your ability to get pregnant comes to an end. This process happens slowly over many months or years, usually between the ages of 67 and 54. Menopause is complete when you have missed your menstrual periods for 12 months. It is important to talk with your health care provider about some of the most common conditions that affect women after menopause (postmenopausal women). These include heart disease, cancer, and bone loss (osteoporosis). Adopting a healthy lifestyle and getting preventive care can help to promote your health and wellness. The actions you take can also lower your chances of developing some of these common conditions. What should I know about menopause? During menopause, you may get a number of symptoms, such as:  Hot flashes. These can be moderate or severe.  Night sweats.  Decrease in sex drive.  Mood swings.  Headaches.  Tiredness.  Irritability.  Memory problems.  Insomnia. Choosing to treat or not to treat these symptoms is a decision that you make with your health care provider. Do I need hormone replacement therapy?  Hormone replacement therapy is effective in treating symptoms that are caused by menopause, such as hot flashes and night sweats.  Hormone replacement carries certain risks, especially as you become older. If you are thinking about using estrogen or estrogen with progestin, discuss the benefits and risks with your health care provider. What is my risk for heart disease and stroke? The risk of heart disease, heart attack, and stroke increases as you age. One of the causes may be a change in the body's hormones during menopause. This can affect how your body uses dietary fats, triglycerides, and cholesterol. Heart attack and stroke are medical emergencies.  There are many things that you can do to help prevent heart disease and stroke. Watch your blood pressure  High blood pressure causes heart disease and increases the risk of stroke. This is more likely to develop in people who have high blood pressure readings, are of African descent, or are overweight.  Have your blood pressure checked: ? Every 3-5 years if you are 64-60 years of age. ? Every year if you are 36 years old or older. Eat a healthy diet   Eat a diet that includes plenty of vegetables, fruits, low-fat dairy products, and lean protein.  Do not eat a lot of foods that are high in solid fats, added sugars, or sodium. Get regular exercise Get regular exercise. This is one of the most important things you can do for your health. Most adults should:  Try to exercise for at least 150 minutes each week. The exercise should increase your heart rate and make you sweat (moderate-intensity exercise).  Try to do strengthening exercises at least twice each week. Do these in addition to the moderate-intensity exercise.  Spend less time sitting. Even light physical activity can be beneficial. Other tips  Work with your health care provider to achieve or maintain a healthy weight.  Do not use any products that contain nicotine or tobacco, such as cigarettes, e-cigarettes, and chewing tobacco. If you need help quitting, ask your health care provider.  Know your numbers. Ask your health care provider to check your cholesterol and your blood sugar (glucose). Continue to have your blood tested as directed by your health care provider. Do I need screening for  cancer? Depending on your health history and family history, you may need to have cancer screening at different stages of your life. This may include screening for:  Breast cancer.  Cervical cancer.  Lung cancer.  Colorectal cancer. What is my risk for osteoporosis? After menopause, you may be at increased risk for osteoporosis.  Osteoporosis is a condition in which bone destruction happens more quickly than new bone creation. To help prevent osteoporosis or the bone fractures that can happen because of osteoporosis, you may take the following actions:  If you are 75-24 years old, get at least 1,000 mg of calcium and at least 600 mg of vitamin D per day.  If you are older than age 99 but younger than age 41, get at least 1,200 mg of calcium and at least 600 mg of vitamin D per day.  If you are older than age 99, get at least 1,200 mg of calcium and at least 800 mg of vitamin D per day. Smoking and drinking excessive alcohol increase the risk of osteoporosis. Eat foods that are rich in calcium and vitamin D, and do weight-bearing exercises several times each week as directed by your health care provider. How does menopause affect my mental health? Depression may occur at any age, but it is more common as you become older. Common symptoms of depression include:  Low or sad mood.  Changes in sleep patterns.  Changes in appetite or eating patterns.  Feeling an overall lack of motivation or enjoyment of activities that you previously enjoyed.  Frequent crying spells. Talk with your health care provider if you think that you are experiencing depression. General instructions See your health care provider for regular wellness exams and vaccines. This may include:  Scheduling regular health, dental, and eye exams.  Getting and maintaining your vaccines. These include: ? Influenza vaccine. Get this vaccine each year before the flu season begins. ? Pneumonia vaccine. ? Shingles vaccine. ? Tetanus, diphtheria, and pertussis (Tdap) booster vaccine. Your health care provider may also recommend other immunizations. Tell your health care provider if you have ever been abused or do not feel safe at home. Summary  Menopause is a normal process in which your ability to get pregnant comes to an end.  This condition causes  hot flashes, night sweats, decreased interest in sex, mood swings, headaches, or lack of sleep.  Treatment for this condition may include hormone replacement therapy.  Take actions to keep yourself healthy, including exercising regularly, eating a healthy diet, watching your weight, and checking your blood pressure and blood sugar levels.  Get screened for cancer and depression. Make sure that you are up to date with all your vaccines. This information is not intended to replace advice given to you by your health care provider. Make sure you discuss any questions you have with your health care provider. Document Released: 06/18/2005 Document Revised: 04/19/2018 Document Reviewed: 04/19/2018 Elsevier Patient Education  2020 [employer].

## 2019-03-09 LAB — TSH: TSH: 0.69 u[IU]/mL (ref 0.35–4.50)

## 2019-03-11 DIAGNOSIS — R5383 Other fatigue: Secondary | ICD-10-CM | POA: Insufficient documentation

## 2019-03-11 NOTE — Assessment & Plan Note (Signed)
She continues to follow up with  neurologist Dr Manuella Ghazi regularly to address primary symptoms which include poor balance, but has not had any recent falls. 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

## 2019-03-11 NOTE — Assessment & Plan Note (Signed)
Screening labs normal.  No history of snoring.  Recommended participating in regular exercise program with goal of achieving a minimum of 30 minutes of aerobic activity 5 days per week.   Lab Results  Component Value Date   CREATININE 0.79 06/09/2015   Lab Results  Component Value Date   ALT 14 06/09/2015   AST 18 06/09/2015   ALKPHOS 69 06/09/2015   BILITOT 0.6 06/09/2015   Lab Results  Component Value Date   TSH 1.68 06/09/2015   Lab Results  Component Value Date   WBC 8.6 06/09/2015   HGB 14.7 06/09/2015   HCT 43.8 06/09/2015   MCV 92.7 06/09/2015   PLT 248.0 06/09/2015    

## 2019-03-11 NOTE — Assessment & Plan Note (Signed)
a1c is stable.  Weight gain addressed. 15 minutes was spent with patient  In reviewing her diet and exercise regimen, providing  counselling about the the role of diet and exercise  In preventing progression from prediabetes to diabetes,and making suggestions on how to modify her current lifestyle.

## 2019-03-11 NOTE — Assessment & Plan Note (Signed)

## 2019-03-11 NOTE — Assessment & Plan Note (Signed)
BP is elevated despite taking metoprolol and telmisartan.  Advised to check home readings and submit

## 2019-05-30 NOTE — Telephone Encounter (Signed)
Reading printed and in yellow folder.

## 2019-06-05 ENCOUNTER — Telehealth: Payer: Self-pay | Admitting: Internal Medicine

## 2019-06-05 NOTE — Telephone Encounter (Signed)
Placed in Dr.Tullo's color folder upfront

## 2019-06-05 NOTE — Telephone Encounter (Signed)
Pt dropped off a Wellness Form to be filled out asap if possible. She knows that Dr. Darrick Huntsman is out of the office this week but needs it filled out and faxed to [phone] by 06/10/19

## 2019-06-06 DIAGNOSIS — Z0279 Encounter for issue of other medical certificate: Secondary | ICD-10-CM

## 2019-06-06 NOTE — Telephone Encounter (Signed)
Everything is up date and there is no date on form stating when it has to be faxed in.

## 2019-06-06 NOTE — Telephone Encounter (Signed)
Monday is Jan 31.  Can you look at form and see if it requires any thing that must be dated,  Like vital signs,  Labs within the last year etc so we have time to get her in this week if necessary

## 2019-06-06 NOTE — Telephone Encounter (Signed)
Form has been completed and placed in quick sign folder.  

## 2019-06-07 NOTE — Telephone Encounter (Signed)
Form has been faxed.

## 2019-06-07 NOTE — Telephone Encounter (Signed)
Pt is calling and states the form has to be faxed by Jan 31st which is Sunday. She said they will charge her $75 per pay period if they don't have it before that day. She is requesting a call back on the status of form.

## 2019-06-07 NOTE — Telephone Encounter (Signed)
Will see if Dr. Lorin Picket can sign the form.

## 2019-06-07 NOTE — Telephone Encounter (Signed)
Would be able to sign this form for pt since it is due by Sunday?

## 2019-06-07 NOTE — Telephone Encounter (Signed)
Pt is aware that form has been faxed. 

## 2019-06-07 NOTE — Telephone Encounter (Signed)
Signed and placed in your chair

## 2019-09-07 IMAGING — MG MM DIGITAL SCREENING BILAT W/ TOMO W/ CAD
8 series · 8 of 24 positions shown · non-contrast
Comparison: Previous exam(s).

CLINICAL DATA: Screening.

EXAM:
DIGITAL SCREENING BILATERAL MAMMOGRAM WITH TOMO AND CAD

[R CC synth-2D]
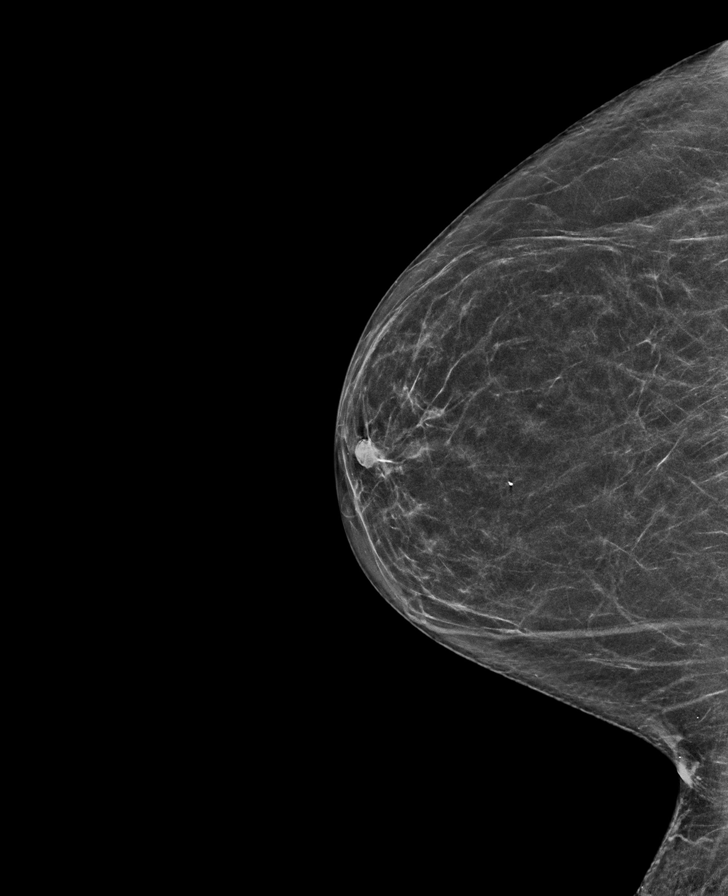

[R MLO synth-2D]
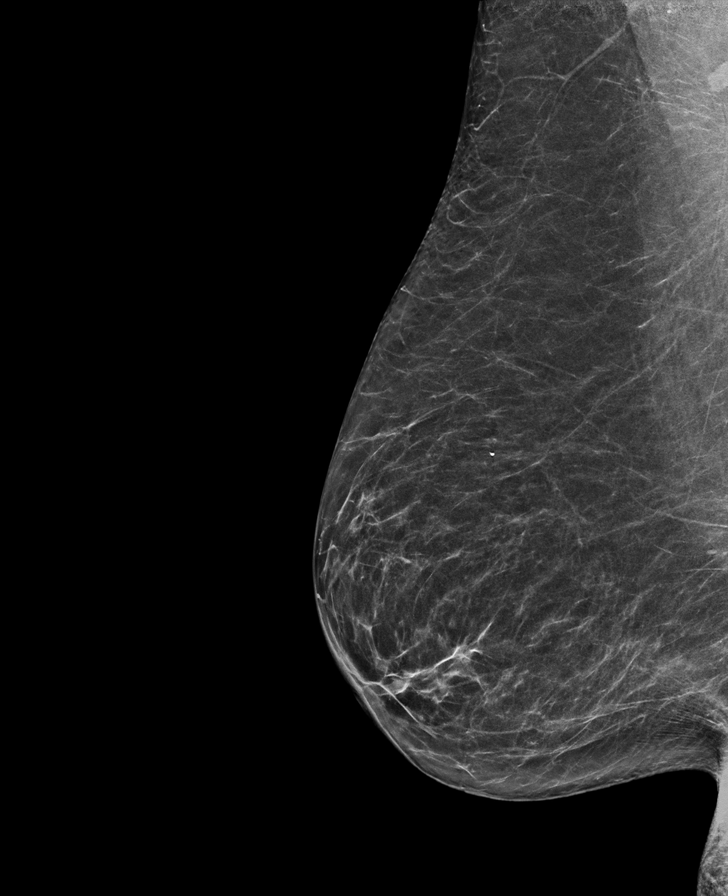

[L MLO synth-2D]
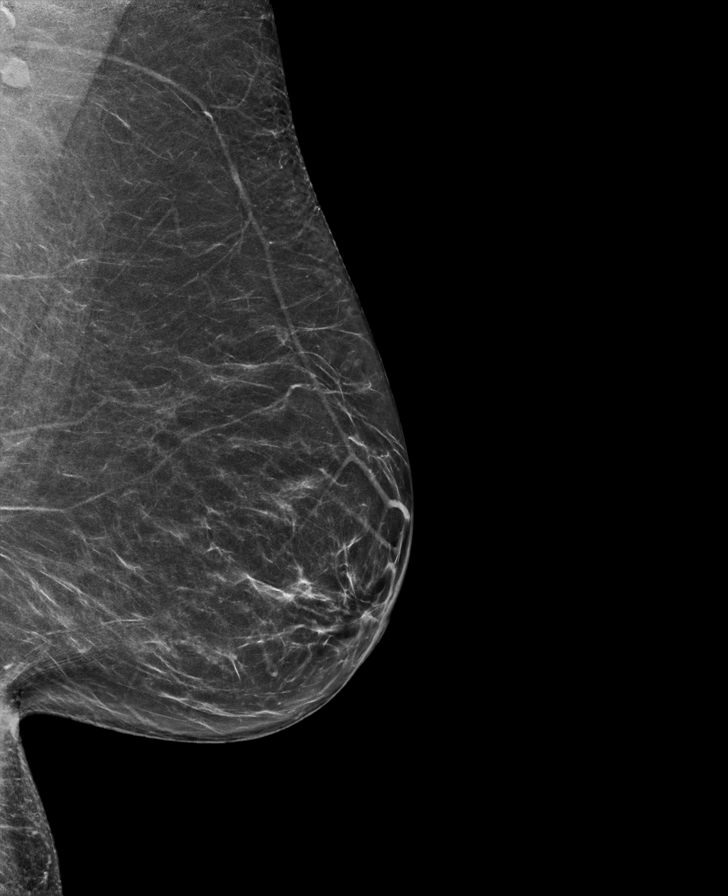

[L CC synth-2D]
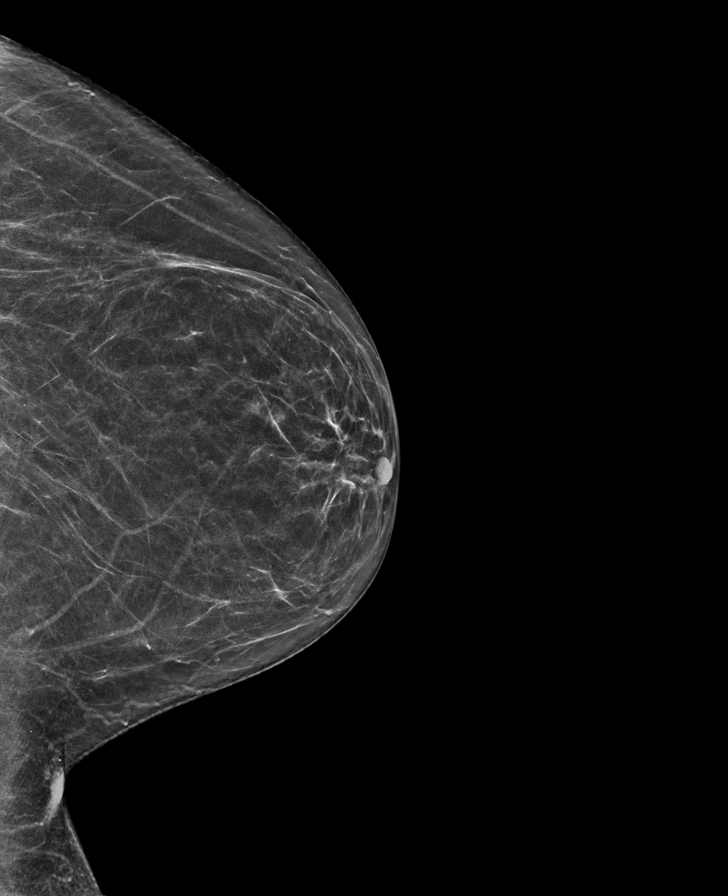

[L CC tomo · tomo slice 31/61.0]
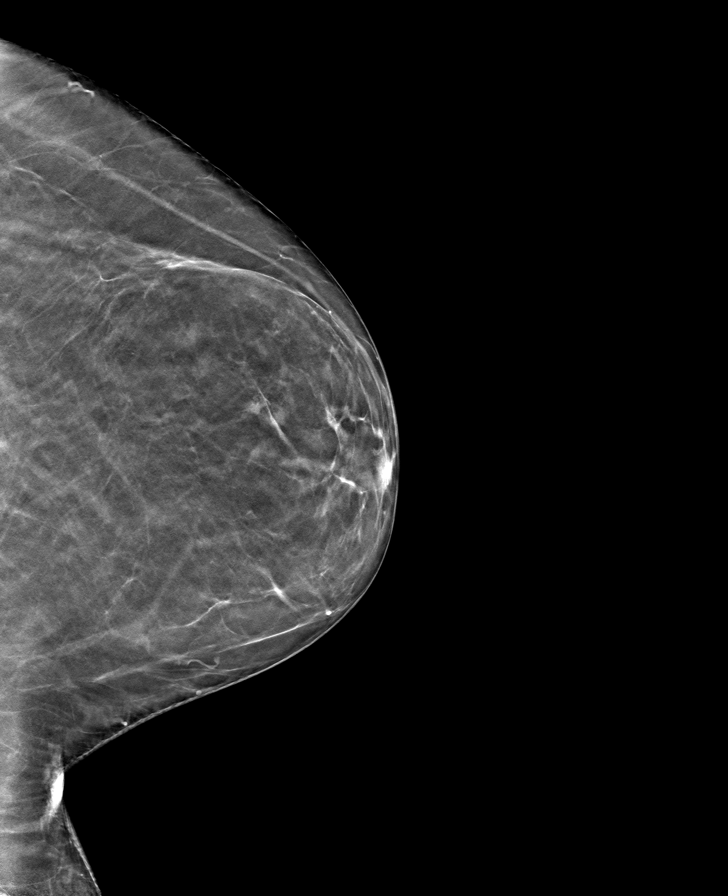

[R MLO tomo · tomo slice 33/64.0]
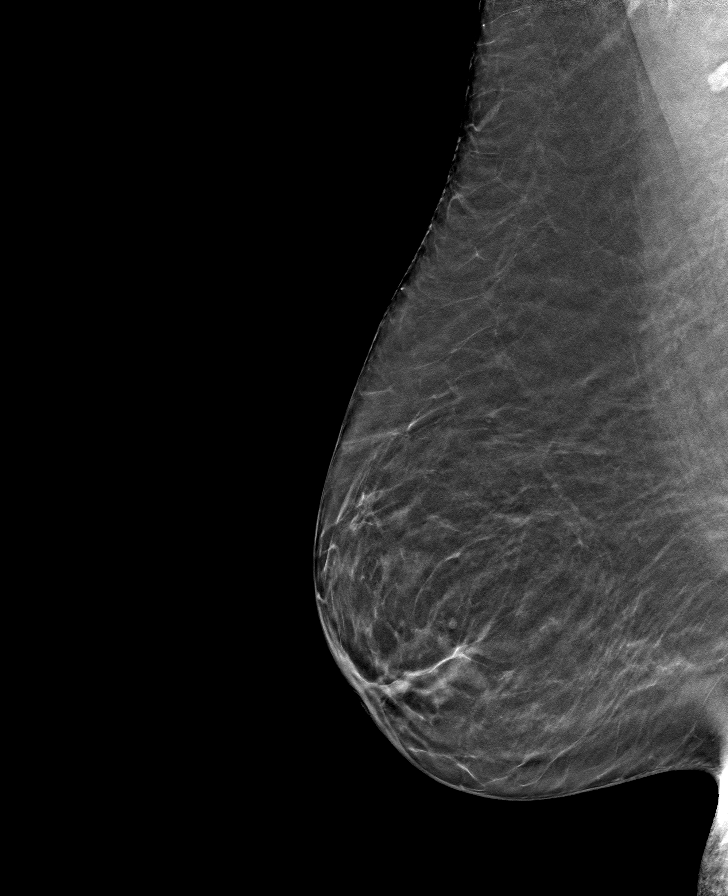

[L MLO tomo · tomo slice 33/66.0]
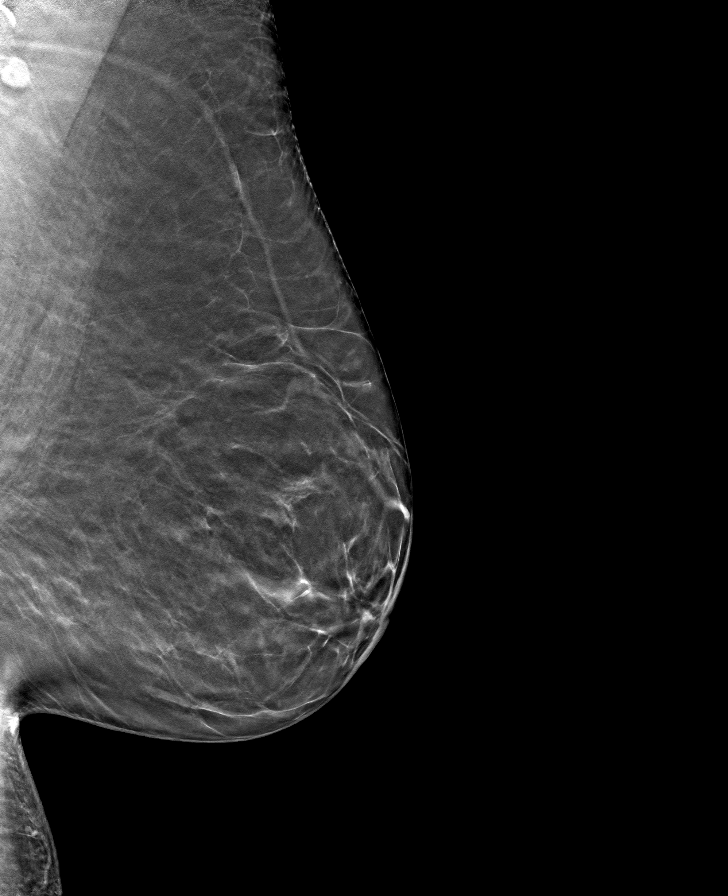

[R CC tomo · tomo slice 30/59.0]
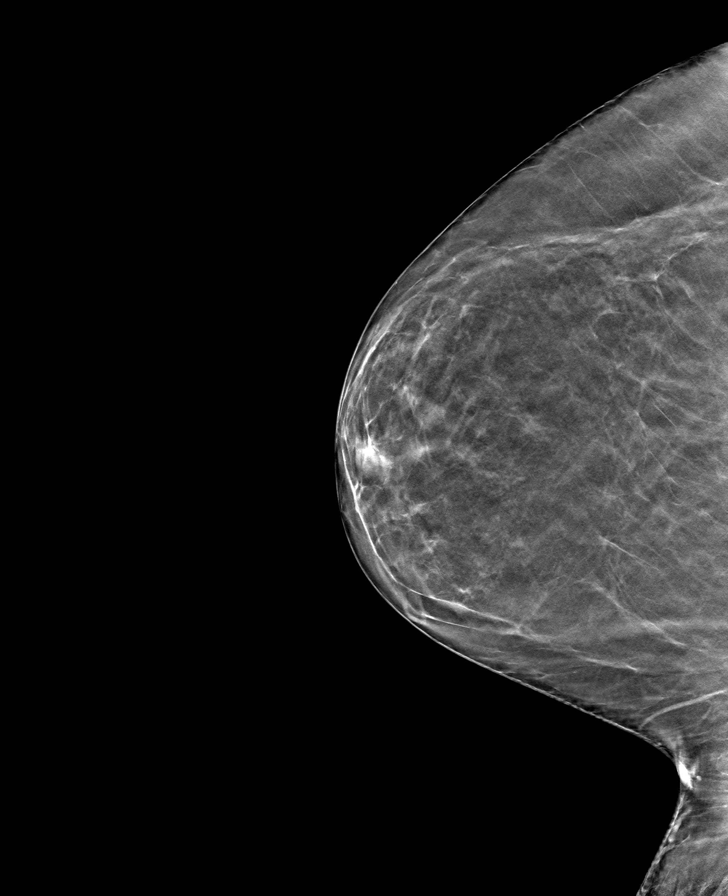

[8 of 24 positions shown; findings below may reference images not displayed]

ACR Breast Density Category b: There are scattered areas of
fibroglandular density.
FINDINGS: There are no findings suspicious for malignancy. Images were
processed with CAD.
IMPRESSION: No mammographic evidence of malignancy. A result letter of this
screening mammogram will be mailed directly to the patient.

RECOMMENDATION:
Screening mammogram in one year. (Code:[id number])

BI-RADS CATEGORY  1: Negative.

## 2019-09-13 ENCOUNTER — Encounter: Payer: Self-pay | Admitting: Internal Medicine

## 2019-09-13 ENCOUNTER — Other Ambulatory Visit: Payer: Self-pay

## 2019-09-13 ENCOUNTER — Ambulatory Visit (INDEPENDENT_AMBULATORY_CARE_PROVIDER_SITE_OTHER): Payer: Managed Care, Other (non HMO) | Admitting: Internal Medicine

## 2019-09-13 VITALS — BP 126/84 | HR 77 | Temp 95.8°F | Resp 15 | Ht 66.0 in | Wt 196.2 lb

## 2019-09-13 DIAGNOSIS — I10 Essential (primary) hypertension: Secondary | ICD-10-CM | POA: Diagnosis not present

## 2019-09-13 DIAGNOSIS — F5101 Primary insomnia: Secondary | ICD-10-CM

## 2019-09-13 DIAGNOSIS — E669 Obesity, unspecified: Secondary | ICD-10-CM

## 2019-09-13 DIAGNOSIS — Z1231 Encounter for screening mammogram for malignant neoplasm of breast: Secondary | ICD-10-CM

## 2019-09-13 DIAGNOSIS — R7303 Prediabetes: Secondary | ICD-10-CM

## 2019-09-13 DIAGNOSIS — F411 Generalized anxiety disorder: Secondary | ICD-10-CM

## 2019-09-13 LAB — COMPREHENSIVE METABOLIC PANEL
ALT: 19 U/L (ref 0–35)
AST: 20 U/L (ref 0–37)
Albumin: 4.2 g/dL (ref 3.5–5.2)
Alkaline Phosphatase: 44 U/L (ref 39–117)
BUN: 17 mg/dL (ref 6–23)
CO2: 30 mEq/L (ref 19–32)
Calcium: 9.4 mg/dL (ref 8.4–10.5)
Chloride: 101 mEq/L (ref 96–112)
Creatinine, Ser: 0.75 mg/dL (ref 0.40–1.20)
GFR: 80.04 mL/min (ref >60.00)
Glucose, Bld: 101 mg/dL — ABNORMAL HIGH (ref 70–99)
Potassium: 4.1 mEq/L (ref 3.5–5.1)
Sodium: 134 mEq/L — ABNORMAL LOW (ref 135–145)
Total Bilirubin: 0.5 mg/dL (ref 0.2–1.2)
Total Protein: 7 g/dL (ref 6.0–8.3)

## 2019-09-13 LAB — LIPID PANEL
Cholesterol: 212 mg/dL — ABNORMAL HIGH (ref 0–200)
HDL: 60.1 mg/dL (ref >39.00)
LDL Cholesterol: 131 mg/dL — ABNORMAL HIGH (ref 0–99)
NonHDL: 151.58
Total CHOL/HDL Ratio: 4
Triglycerides: 104 mg/dL (ref 0.0–149.0)
VLDL: 20.8 mg/dL (ref 0.0–40.0)

## 2019-09-13 LAB — HEMOGLOBIN A1C: Hgb A1c MFr Bld: 6 % (ref 4.6–6.5)

## 2019-09-13 MED ORDER — ALPRAZOLAM 0.5 MG PO TABS: 1.0000 mg | ORAL_TABLET | Freq: Two times a day (BID) | ORAL | 2 refills | Status: DC | PRN

## 2019-09-13 NOTE — Progress Notes (Signed)
Subjective:  Patient ID: Natalie Pugh, female    DOB: Jul 07, 1965  Age: 54 y.o. MRN: [id number]  CC: The primary encounter diagnosis was Prediabetes. Diagnoses of Primary insomnia, Essential hypertension, Encounter for screening mammogram for malignant neoplasm of breast, Generalized anxiety disorder, and Obesity (BMI 30-39.9) were also pertinent to this visit.  HPI Natalie Pugh presents for follow up on hypertension,  Prediabetes/obesity,  GAD.  This visit occurred during the SARS-CoV-2 public health emergency.  Safety protocols were in place, including screening questions prior to the visit, additional usage of staff PPE, and extensive cleaning of exam room while observing appropriate contact time as indicated for disinfecting solutions.    Patient has received NO  doses of the available COVID 19 vaccines.   Patient continues to mask when outside of the home except when walking in yard or at safe distances from others .  Patient denies any change in mood or development of unhealthy behaviors resuting from the pandemic's restriction of activities and socialization.     Obesity:  Has not been able to lose any weight due to lack of exercise,  But she has recently started a walking program using walking sticks for stability   Hypertension: patient checks blood pressure twice weekly at home.  Readings have been for the most part < 140/80 at rest . Patient is following a reduced salt diet most days and is taking medications as prescribed  GAD:  Taking lexapro daily  No panic attacks.  [place] Score for mammogaram last one oct 2019   Outpatient Medications Prior to Visit  Medication Sig Dispense Refill  . Cholecalciferol (VITAMIN D3) 1000 UNITS CAPS Take 2 capsules by mouth daily.     . Cranberry 250 MG CAPS Take 2 capsules by mouth daily.    Marland Kitchen escitalopram (LEXAPRO) 10 MG tablet Take 1 tablet (10 mg total) by mouth daily. 90 tablet 1  . Magnesium 200 MG TABS Take 1 tablet by mouth daily.     . metoprolol succinate (TOPROL-XL) 50 MG 24 hr tablet TAKE 1 TABLET(50 MG) BY MOUTH DAILY 90 tablet 1  . Multiple Vitamin (MULTIVITAMIN) tablet Take 1 tablet by mouth daily.    Marland Kitchen oxybutynin (DITROPAN-XL) 5 MG 24 hr tablet Take 1 tablet (5 mg total) by mouth daily as needed. 90 tablet 1  . REBIF 44 MCG/0.5ML injection INJECT 1 PREFILLED SYRINGE SUBCUTANEOUSLY 3 TIMES PER WEEK AS DIRECTED 18 mL 0  . telmisartan (MICARDIS) 20 MG tablet TAKE 1 TABLET(20 MG) BY MOUTH DAILY 90 tablet 1  . ALPRAZolam (XANAX) 0.5 MG tablet Take 2 tablets (1 mg total) by mouth 2 (two) times daily as needed for anxiety or sleep. 60 tablet 2   No facility-administered medications prior to visit.    Review of Systems;  Patient denies headache, fevers, malaise, unintentional weight loss, skin rash, eye pain, sinus congestion and sinus pain, sore throat, dysphagia,  hemoptysis , cough, dyspnea, wheezing, chest pain, palpitations, orthopnea, edema, abdominal pain, nausea, melena, diarrhea, constipation, flank pain, dysuria, hematuria, urinary  Frequency, nocturia, numbness, tingling, seizures,  Focal weakness, Loss of consciousness,  Tremor, insomnia, depression, anxiety, and suicidal ideation.      Objective:  BP 126/84 (BP Location: Left Arm, Patient Position: Sitting, Cuff Size: Normal)   Pulse 77   Temp (!) 95.8 F (35.4 C) (Temporal)   Resp 15   Ht 5\' 6"  (1.676 m)   Wt 196 lb 3.2 oz (89 kg)   LMP 09/28/2014 (Exact Date)  SpO2 97%   BMI 31.67 kg/m   BP Readings from Last 3 Encounters:  09/13/19 126/84  03/08/19 (!) 140/94  07/21/18 120/90    Wt Readings from Last 3 Encounters:  09/13/19 196 lb 3.2 oz (89 kg)  03/08/19 197 lb 9.6 oz (89.6 kg)  07/21/18 186 lb 9.6 oz (84.6 kg)    General appearance: alert, cooperative and appears stated age Ears: normal TM's and external ear canals both ears Throat: lips, mucosa, and tongue normal; teeth and gums normal Neck: no adenopathy, no carotid bruit,  supple, symmetrical, trachea midline and thyroid not enlarged, symmetric, no tenderness/mass/nodules Back: symmetric, no curvature. ROM normal. No CVA tenderness. Lungs: clear to auscultation bilaterally Heart: regular rate and rhythm, S1, S2 normal, no murmur, click, rub or gallop Abdomen: soft, non-tender; bowel sounds normal; no masses,  no organomegaly Pulses: 2+ and symmetric Skin: Skin color, texture, turgor normal. No rashes or lesions Lymph nodes: Cervical, supraclavicular, and axillary nodes normal.  Lab Results  Component Value Date   HGBA1C 6.0 09/13/2019   HGBA1C 5.9 03/08/2019   HGBA1C 6.0 07/21/2018    Lab Results  Component Value Date   CREATININE 0.75 09/13/2019   CREATININE 0.65 03/08/2019   CREATININE 0.72 07/21/2018    Lab Results  Component Value Date   WBC 3.7 (L) 03/25/2017   HGB 12.4 03/25/2017   HCT 37.7 03/25/2017   PLT 227.0 03/25/2017   GLUCOSE 101 (H) 09/13/2019   CHOL 212 (H) 09/13/2019   TRIG 104.0 09/13/2019   HDL 60.10 09/13/2019   LDLDIRECT 107.0 03/25/2017   LDLCALC 131 (H) 09/13/2019   ALT 19 09/13/2019   AST 20 09/13/2019   NA 134 (L) 09/13/2019   K 4.1 09/13/2019   CL 101 09/13/2019   CREATININE 0.75 09/13/2019   BUN 17 09/13/2019   CO2 30 09/13/2019   TSH 0.69 03/08/2019   HGBA1C 6.0 09/13/2019   MICROALBUR <0.7 03/08/2019    MM 3D SCREEN BREAST BILATERAL  Result Date: 03/07/2018 CLINICAL DATA:  Screening. EXAM: DIGITAL SCREENING BILATERAL MAMMOGRAM WITH TOMO AND CAD COMPARISON:  Previous exam(s). ACR Breast Density Category b: There are scattered areas of fibroglandular density. FINDINGS: There are no findings suspicious for malignancy. Images were processed with CAD. IMPRESSION: No mammographic evidence of malignancy. A result letter of this screening mammogram will be mailed directly to the patient. RECOMMENDATION: Screening mammogram in one year. (Code:[id number]) BI-RADS CATEGORY  1: Negative. Electronically Signed   By:  Britta Mccreedy M.D.   On: 03/07/2018 16:57    Assessment & Plan:   Problem List Items Addressed This Visit      Unprioritized   Essential hypertension    Well controlled on current regimen. Renal function stable, no changes today.  Lab Results  Component Value Date   CREATININE 0.75 09/13/2019   Lab Results  Component Value Date   NA 134 (L) 09/13/2019   K 4.1 09/13/2019   CL 101 09/13/2019   CO2 30 09/13/2019         Relevant Orders   Comprehensive metabolic panel (Completed)   Generalized anxiety disorder    Managed with lexapro and rare prn use of alprazolam.  The risks and benefits of benzodiazepine use were  Reviewed with patient today including excessive sedation leading to respiratory depression,  impaired thinking/driving, and addiction.  Patient was advised to avoid concurrent use with alcohol, to use medication only as needed and not to share with others  .  Relevant Medications   ALPRAZolam (XANAX) 0.5 MG tablet   Obesity (BMI 30-39.9)    I have addressed  BMI and recommended a low glycemic index diet utilizing smaller more frequent meals to increase metabolism.  I have also recommended that patient start exercising with a goal of 30 minutes of aerobic exercise a minimum of 5 days per week.      Prediabetes - Primary   Relevant Orders   Hemoglobin A1c (Completed)   Lipid panel (Completed)    Other Visit Diagnoses    Primary insomnia       Relevant Medications   ALPRAZolam (XANAX) 0.5 MG tablet   Encounter for screening mammogram for malignant neoplasm of breast       Relevant Orders   MM 3D SCREEN BREAST BILATERAL     A total of 20 minutes of face to face time was spent with patient more than half of which was spent in counselling about the above mentioned conditions  and coordination of care  I am having Shelbe L. Patalano maintain her Vitamin D3, multivitamin, Rebif, Cranberry, Magnesium, telmisartan, metoprolol succinate, escitalopram, oxybutynin,  and ALPRAZolam.  Meds ordered this encounter  Medications  . ALPRAZolam (XANAX) 0.5 MG tablet    Sig: Take 2 tablets (1 mg total) by mouth 2 (two) times daily as needed for anxiety or sleep.    Dispense:  60 tablet    Refill:  2    Medications Discontinued During This Encounter  Medication Reason  . ALPRAZolam (XANAX) 0.5 MG tablet Reorder    Follow-up: No follow-ups on file.   Crecencio Mc, MD

## 2019-09-13 NOTE — Patient Instructions (Signed)
Good to see you!  I will send you the results comments in a day or two

## 2019-09-15 NOTE — Assessment & Plan Note (Signed)
I have addressed  BMI and recommended a low glycemic index diet utilizing smaller more frequent meals to increase metabolism.  I have also recommended that patient start exercising with a goal of 30 minutes of aerobic exercise a minimum of 5 days per week.  

## 2019-09-15 NOTE — Assessment & Plan Note (Signed)
Well controlled on current regimen. Renal function stable, no changes today.  Lab Results  Component Value Date   CREATININE 0.75 09/13/2019   Lab Results  Component Value Date   NA 134 (L) 09/13/2019   K 4.1 09/13/2019   CL 101 09/13/2019   CO2 30 09/13/2019

## 2019-09-15 NOTE — Assessment & Plan Note (Signed)
Managed with lexapro and rare prn use of alprazolam.  The risks and benefits of benzodiazepine use were  Reviewed with patient today including excessive sedation leading to respiratory depression,  impaired thinking/driving, and addiction.  Patient was advised to avoid concurrent use with alcohol, to use medication only as needed and not to share with others  .

## 2019-10-04 ENCOUNTER — Telehealth: Payer: Self-pay | Admitting: Internal Medicine

## 2019-10-04 MED ORDER — METOPROLOL SUCCINATE ER 50 MG PO TB24: ORAL_TABLET | ORAL | 1 refills | Status: DC

## 2019-10-04 MED ORDER — TELMISARTAN 20 MG PO TABS: ORAL_TABLET | ORAL | 1 refills | Status: DC

## 2019-10-04 MED ORDER — ESCITALOPRAM OXALATE 10 MG PO TABS: 10.0000 mg | ORAL_TABLET | Freq: Every day | ORAL | 1 refills | Status: DC

## 2019-10-04 NOTE — Telephone Encounter (Signed)
Patient needs he following refills; metoprolol succinate (TOPROL-XL) 50 MG 24 hr tablet, escitalopram (LEXAPRO) 10 MG tablet, and telmisartan (MICARDIS) 20 MG tablet.

## 2020-01-29 ENCOUNTER — Telehealth: Payer: Self-pay | Admitting: Internal Medicine

## 2020-01-29 NOTE — Telephone Encounter (Signed)
Pt changed insurances and she said that [facility] will be sending over a fax for some of her medications. She will still use [facility] for immediate medications. I added them to her preferred pharmacy list.

## 2020-02-01 ENCOUNTER — Telehealth: Payer: Self-pay | Admitting: Internal Medicine

## 2020-02-01 MED ORDER — ESCITALOPRAM OXALATE 10 MG PO TABS: 10.0000 mg | ORAL_TABLET | Freq: Every day | ORAL | 1 refills | Status: DC

## 2020-02-01 MED ORDER — TELMISARTAN 20 MG PO TABS: ORAL_TABLET | ORAL | 1 refills | Status: DC

## 2020-02-01 MED ORDER — METOPROLOL SUCCINATE ER 50 MG PO TB24: ORAL_TABLET | ORAL | 1 refills | Status: DC

## 2020-02-01 NOTE — Telephone Encounter (Signed)
Patient called in need refill on telmisartan (MICARDIS) 20 MG tablet, metoprolol succinate (TOPROL-XL) 50 MG 24 hr tablet ,escitalopram (LEXAPRO) 10 MG tablet  She stated that  Her mail order pharmacy faxed over from [facility]

## 2020-03-12 ENCOUNTER — Encounter: Payer: Managed Care, Other (non HMO) | Admitting: Internal Medicine

## 2020-03-27 ENCOUNTER — Other Ambulatory Visit: Payer: Self-pay | Admitting: Internal Medicine

## 2020-04-21 ENCOUNTER — Encounter: Payer: Managed Care, Other (non HMO) | Admitting: Internal Medicine

## 2020-05-05 ENCOUNTER — Other Ambulatory Visit: Payer: Self-pay

## 2020-05-05 DIAGNOSIS — Z20822 Contact with and (suspected) exposure to covid-19: Secondary | ICD-10-CM

## 2020-05-06 LAB — SARS-COV-2, NAA 2 DAY TAT

## 2020-05-06 LAB — NOVEL CORONAVIRUS, NAA: SARS-CoV-2, NAA: NOT DETECTED

## 2020-05-27 ENCOUNTER — Encounter: Payer: Self-pay | Admitting: Internal Medicine

## 2020-06-19 ENCOUNTER — Other Ambulatory Visit: Payer: Self-pay

## 2020-06-19 ENCOUNTER — Encounter: Payer: Self-pay | Admitting: Internal Medicine

## 2020-06-19 ENCOUNTER — Other Ambulatory Visit (HOSPITAL_COMMUNITY)
Admission: RE | Admit: 2020-06-19 | Discharge: 2020-06-19 | Disposition: A | Discharge status: Home / Self Care | Payer: 59 | Source: Ambulatory Visit | Attending: Internal Medicine | Admitting: Internal Medicine

## 2020-06-19 ENCOUNTER — Ambulatory Visit (INDEPENDENT_AMBULATORY_CARE_PROVIDER_SITE_OTHER): Payer: 59 | Admitting: Internal Medicine

## 2020-06-19 VITALS — BP 140/80 | HR 81 | Temp 97.8°F | Ht 65.98 in | Wt 199.6 lb

## 2020-06-19 DIAGNOSIS — Z1231 Encounter for screening mammogram for malignant neoplasm of breast: Secondary | ICD-10-CM

## 2020-06-19 DIAGNOSIS — K5901 Slow transit constipation: Secondary | ICD-10-CM

## 2020-06-19 DIAGNOSIS — Z124 Encounter for screening for malignant neoplasm of cervix: Secondary | ICD-10-CM | POA: Diagnosis not present

## 2020-06-19 DIAGNOSIS — R7303 Prediabetes: Secondary | ICD-10-CM | POA: Diagnosis not present

## 2020-06-19 DIAGNOSIS — I10 Essential (primary) hypertension: Secondary | ICD-10-CM

## 2020-06-19 DIAGNOSIS — Z0000 Encounter for general adult medical examination without abnormal findings: Secondary | ICD-10-CM

## 2020-06-19 DIAGNOSIS — E559 Vitamin D deficiency, unspecified: Secondary | ICD-10-CM

## 2020-06-19 DIAGNOSIS — R5383 Other fatigue: Secondary | ICD-10-CM | POA: Diagnosis not present

## 2020-06-19 DIAGNOSIS — F5101 Primary insomnia: Secondary | ICD-10-CM

## 2020-06-19 DIAGNOSIS — G35 Multiple sclerosis: Secondary | ICD-10-CM

## 2020-06-19 LAB — COMPREHENSIVE METABOLIC PANEL
ALT: 16 U/L (ref 0–35)
AST: 19 U/L (ref 0–37)
Albumin: 4.2 g/dL (ref 3.5–5.2)
Alkaline Phosphatase: 48 U/L (ref 39–117)
BUN: 13 mg/dL (ref 6–23)
CO2: 30 mEq/L (ref 19–32)
Calcium: 9.4 mg/dL (ref 8.4–10.5)
Chloride: 99 mEq/L (ref 96–112)
Creatinine, Ser: 0.71 mg/dL (ref 0.40–1.20)
GFR: 95.05 mL/min (ref >60.00)
Glucose, Bld: 84 mg/dL (ref 70–99)
Potassium: 4.2 mEq/L (ref 3.5–5.1)
Sodium: 137 mEq/L (ref 135–145)
Total Bilirubin: 0.5 mg/dL (ref 0.2–1.2)
Total Protein: 7 g/dL (ref 6.0–8.3)

## 2020-06-19 LAB — LIPID PANEL
Cholesterol: 205 mg/dL — ABNORMAL HIGH (ref 0–200)
HDL: 58 mg/dL (ref >39.00)
LDL Cholesterol: 128 mg/dL — ABNORMAL HIGH (ref 0–99)
NonHDL: 146.56
Total CHOL/HDL Ratio: 4
Triglycerides: 92 mg/dL (ref 0.0–149.0)
VLDL: 18.4 mg/dL (ref 0.0–40.0)

## 2020-06-19 LAB — MICROALBUMIN / CREATININE URINE RATIO
Creatinine,U: 68.7 mg/dL
Microalb Creat Ratio: 2.2 mg/g (ref 0.0–30.0)
Microalb, Ur: 1.5 mg/dL (ref 0.0–1.9)

## 2020-06-19 LAB — TSH: TSH: 1.1 u[IU]/mL (ref 0.35–4.50)

## 2020-06-19 LAB — HEMOGLOBIN A1C: Hgb A1c MFr Bld: 6 % (ref 4.6–6.5)

## 2020-06-19 LAB — VITAMIN D 25 HYDROXY (VIT D DEFICIENCY, FRACTURES): VITD: 66.37 ng/mL (ref 30.00–100.00)

## 2020-06-19 MED ORDER — ALPRAZOLAM 0.5 MG PO TABS: 1.0000 mg | ORAL_TABLET | Freq: Two times a day (BID) | ORAL | 2 refills | Status: DC | PRN

## 2020-06-19 NOTE — Patient Instructions (Signed)

## 2020-06-19 NOTE — Progress Notes (Signed)
Patient ID: Natalie Pugh, female    DOB: 07-Apr-1966  Age: 55 y.o. MRN: [id number]  The patient is here for annual preventive  examination and management of other chronic and acute problems.  This visit occurred during the SARS-CoV-2 public health emergency.  Safety protocols were in place, including screening questions prior to the visit, additional usage of staff PPE, and extensive cleaning of exam room while observing appropriate contact time as indicated for disinfecting solutions.      The risk factors are reflected in the social history.  The roster of all physicians providing medical care to patient - is listed in the Snapshot section of the chart.  Activities of daily living:  The patient is 100% independent in all ADLs: dressing, toileting, feeding as well as independent mobility  Home safety : The patient has smoke detectors in the home. They wear seatbelts.  There are no firearms at home. There is no violence in the home.   There is no risks for hepatitis, STDs or HIV. There is no   history of blood transfusion. They have no travel history to infectious disease endemic areas of the world.  The patient has seen their dentist in the last six month. They have seen their eye doctor in the last year. They admit to slight hearing difficulty with regard to whispered voices and some television programs.  They have deferred audiologic testing in the last year.  They do not  have excessive sun exposure. Discussed the need for sun protection: hats, long sleeves and use of sunscreen if there is significant sun exposure.   Diet: the importance of a healthy diet is discussed. She does have a healthy diet.  She intermittently fasts.  Has given up diet sodas.   The benefits of regular aerobic exercise were discussed. She was walking  Regularly  using walking sticks but had problems with recurrent  falls and fecal incontinence occurring during walks so she has modified her exercise program using a  good quality under desk cycle machine  And using 5 lb hand weights for upper body strength.  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   Depression screen: there are no signs or vegative symptoms of depression- irritability, change in appetite, anhedonia, sadness/tearfullness.  Cognitive assessment: the patient manages all their financial and personal affairs and is actively engaged. They could relate day,date,year and events; recalled 2/3 objects at 3 minutes; performed clock-face test normally.  The following portions of the patient's history were reviewed and updated as appropriate: allergies, current medications, past family history, past medical history,  past surgical history, past social history  and problem list.  Visual acuity was not assessed per patient preference since she has regular follow up with her ophthalmologist. Hearing and body mass index were assessed and reviewed.   During the course of the visit the patient was educated and counseled about appropriate screening and preventive services including : fall prevention , diabetes screening, nutrition counseling, colorectal cancer screening, and recommended immunizations.    CC: The primary encounter diagnosis was Encounter for preventive health examination. Diagnoses of Essential hypertension, Prediabetes, Fatigue, unspecified type, Vitamin D deficiency, Cervical cancer screening, Breast cancer screening by mammogram, Primary insomnia, Multiple sclerosis, relapsing-remitting (HCC), and Slow transit constipation were also pertinent to this visit.  1) MS:  Feels like she has developed some weakness , using a cane with walking her aged dog,  Using a self cath for  bladder   2) HTN: home readings have been consistently 120-130 systolic with diastolics 80 to 85   History Natalie Pugh has a past medical history of Anxiety, Diabetes mellitus, Hypertension, Multiple sclerosis,  relapsing-remitting (HCC) (2001), Neurogenic bladder, and SVT (supraventricular tachycardia) (HCC).   She has a past surgical history that includes Colonoscopy with propofol (N/A, 01/29/2015) and Lung removal, partial (Left, 2016).   Her family history includes Breast cancer (age of onset: 58) in her maternal aunt; COPD in her mother; Cancer (age of onset: 55) in her maternal aunt; Heart disease in her mother; Hyperlipidemia in her paternal uncle.She reports that she quit smoking about 26 years ago. Her smoking use included cigarettes. She has never used smokeless tobacco. She reports current alcohol use. She reports that she does not use drugs.  Outpatient Medications Prior to Visit  Medication Sig Dispense Refill  . Cholecalciferol (VITAMIN D3) 1000 UNITS CAPS Take 2 capsules by mouth daily.     . Cranberry 250 MG CAPS Take 2 capsules by mouth daily.    Marland Kitchen escitalopram (LEXAPRO) 10 MG tablet TAKE 1 TABLET(10 MG) BY MOUTH DAILY 90 tablet 1  . Magnesium 200 MG TABS Take 1 tablet by mouth daily.    . metoprolol succinate (TOPROL-XL) 50 MG 24 hr tablet TAKE 1 TABLET(50 MG) BY MOUTH DAILY 90 tablet 1  . Multiple Vitamin (MULTIVITAMIN) tablet Take 1 tablet by mouth daily.    Marland Kitchen oxybutynin (DITROPAN-XL) 5 MG 24 hr tablet Take 1 tablet (5 mg total) by mouth daily as needed. 90 tablet 1  . REBIF 44 MCG/0.5ML injection INJECT 1 PREFILLED SYRINGE SUBCUTANEOUSLY 3 TIMES PER WEEK AS DIRECTED 18 mL 0  . telmisartan (MICARDIS) 20 MG tablet TAKE 1 TABLET(20 MG) BY MOUTH DAILY 90 tablet 1  . ALPRAZolam (XANAX) 0.5 MG tablet Take 2 tablets (1 mg total) by mouth 2 (two) times daily as needed for anxiety or sleep. 60 tablet 2   No facility-administered medications prior to visit.    Review of Systems  Patient denies headache, fevers, malaise, unintentional weight loss, skin rash, eye pain, sinus congestion and sinus pain, sore throat, dysphagia,  hemoptysis , cough, dyspnea, wheezing, chest pain, palpitations,  orthopnea, edema, abdominal pain, nausea, melena, diarrhea, constipation, flank pain, dysuria, hematuria, urinary  Frequency, nocturia, numbness, tingling, seizures,  Focal weakness, Loss of consciousness,  Tremor, insomnia, depression, anxiety, and suicidal ideation.     Objective:  BP 140/80 (Cuff Size: Normal)   Pulse 81   Temp 97.8 F (36.6 C)   Ht 5' 5.98" (1.676 m)   Wt 199 lb 9.6 oz (90.5 kg)   LMP 09/28/2014 (Exact Date)   SpO2 99%   BMI 32.23 kg/m   Physical Exam  General Appearance:    Alert, cooperative, no distress, appears stated age  Head:    Normocephalic, without obvious abnormality, atraumatic  Eyes:    PERRL, conjunctiva/corneas clear, EOM's intact, fundi    benign, both eyes  Ears:    Normal TM's and external ear canals, both ears  Nose:   Nares normal, septum midline, mucosa normal, no drainage    or sinus tenderness  Throat:   Lips, mucosa, and tongue normal; teeth and gums normal  Neck:   Supple, symmetrical, trachea midline, no adenopathy;    thyroid:  no enlargement/tenderness/nodules; no carotid   bruit or JVD  Back:     Symmetric, no curvature, ROM normal, no CVA tenderness  Lungs:     Clear to auscultation bilaterally, respirations  unlabored  Chest Wall:    No tenderness or deformity   Heart:    Regular rate and rhythm, S1 and S2 normal, no murmur, rub   or gallop  Breast Exam:    No tenderness, masses, or nipple abnormality  Abdomen:     Soft, non-tender, bowel sounds active all four quadrants,    no masses, no organomegaly  Genitalia:    Pelvic: cervix normal in appearance, external genitalia normal, no adnexal masses or tenderness, no cervical motion tenderness, rectovaginal septum normal, uterus normal size, shape, and consistency and vagina normal without discharge  Extremities:   Extremities normal, atraumatic, no cyanosis or edema  Pulses:   2+ and symmetric all extremities  Skin:   Skin color, texture, turgor normal, no rashes or lesions   Lymph nodes:   Cervical, supraclavicular, and axillary nodes normal  Neurologic:   CNII-XII intact, normal strength, sensation and reflexes    throughout      Assessment & Plan:   Problem List Items Addressed This Visit      Unprioritized   Constipation    Managed with bulk forming laxatives.       Encounter for preventive health examination - Primary    age appropriate education and counseling updated, referrals for preventative services and immunizations addressed, dietary and smoking counseling addressed, most recent labs reviewed.  I have personally reviewed and have noted:  1) the patient's medical and social history 2) The pt's use of alcohol, tobacco, and illicit drugs 3) The patient's current medications and supplements 4) Functional ability including ADL's, fall risk, home safety risk, hearing and visual impairment 5) Diet and physical activities 6) Evidence for depression or mood disorder 7) The patient's height, weight, and BMI have been recorded in the chart  I have made referrals, and provided counseling and education based on review of the above      Essential hypertension    she reports compliance with medication regimen  Of metoprolol and  telmisartan ,  but has an elevated reading today in office.  She is not using NSAIDs daily.  Will increase telmisartan to 40 mg if Home readings have been above 130/80.  She has been asked to continue to check her  BP  at home and  submit readings for evaluation. Renal function, electrolytes and screen for proteinuria are all normal    Lab Results  Component Value Date   CREATININE 0.71 06/19/2020   Lab Results  Component Value Date   NA 137 06/19/2020   K 4.2 06/19/2020   CL 99 06/19/2020   CO2 30 06/19/2020   Lab Results  Component Value Date   MICROALBUR 1.5 06/19/2020   MICROALBUR <0.7 03/08/2019           Relevant Orders   Lipid panel (Completed)   Comprehensive metabolic panel (Completed)    Microalbumin / creatinine urine ratio (Completed)   Fatigue   Relevant Orders   TSH (Completed)   Multiple sclerosis, relapsing-remitting (HCC)    She now has local follow up with  neurologist Dr Sherryll Burger regularly to address primary symptoms which include poor balance, and has had several minor  Falls as well as fecal incontinence during exertion . 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  She self catheterizes to manage urinary retention /neurogenic bladder.  Her current medication is Rebif.      Prediabetes    a1c is stable.  Weight gain addressed.  I reviewed  her diet  and exercise regimen, providing  counselling about the the role of diet and exercise  In preventing progression from prediabetes to diabetes,and making suggestions on how to modify her current lifestyle.     Lab Results  Component Value Date   HGBA1C 6.0 06/19/2020         Relevant Orders   Hemoglobin A1c (Completed)   Primary insomnia    Managed with low dose ALPRAZOLAM.  The risks and benefits of benzodiazepine use were reviewed with patient today including excessive sedation leading to respiratory depression,  impaired thinking/driving, and addiction.  Patient was advised to avoid concurrent use with alcohol, to use medication only as needed and not to share with others  .       Relevant Medications   ALPRAZolam (XANAX) 0.5 MG tablet    Other Visit Diagnoses    Vitamin D deficiency       Relevant Orders   VITAMIN D 25 Hydroxy (Vit-D Deficiency, Fractures) (Completed)   Cervical cancer screening       Relevant Orders   Cytology - PAP( [place])   Breast cancer screening by mammogram       Relevant Orders   MM 3D SCREEN BREAST BILATERAL      I am having Natalie Pugh maintain her Vitamin D3, multivitamin, Rebif, Cranberry, Magnesium, oxybutynin, metoprolol succinate, escitalopram, telmisartan, and ALPRAZolam.  Meds ordered this encounter  Medications  . ALPRAZolam (XANAX) 0.5 MG  tablet    Sig: Take 2 tablets (1 mg total) by mouth 2 (two) times daily as needed for anxiety or sleep.    Dispense:  60 tablet    Refill:  2    Medications Discontinued During This Encounter  Medication Reason  . ALPRAZolam (XANAX) 0.5 MG tablet Reorder    Follow-up: No follow-ups on file.   Sherlene Shams, MD

## 2020-06-21 ENCOUNTER — Encounter: Payer: Self-pay | Admitting: Internal Medicine

## 2020-06-21 DIAGNOSIS — F5101 Primary insomnia: Secondary | ICD-10-CM | POA: Insufficient documentation

## 2020-06-21 NOTE — Assessment & Plan Note (Signed)
Managed with bulk forming laxatives.

## 2020-06-21 NOTE — Assessment & Plan Note (Signed)
a1c is stable.  Weight gain addressed.  I reviewed  her diet and exercise regimen, providing  counselling about the the role of diet and exercise  In preventing progression from prediabetes to diabetes,and making suggestions on how to modify her current lifestyle.     Lab Results  Component Value Date   HGBA1C 6.0 06/19/2020

## 2020-06-21 NOTE — Assessment & Plan Note (Addendum)
She now has local follow up with  neurologist Dr Sherryll Burger regularly to address primary symptoms which include poor balance, and has had several minor  Falls as well as fecal incontinence during exertion . 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  She self catheterizes to manage urinary retention /neurogenic bladder.  Her current medication is Rebif.

## 2020-06-21 NOTE — Assessment & Plan Note (Addendum)
she reports compliance with medication regimen  Of metoprolol and  telmisartan ,  but has an elevated reading today in office.  She is not using NSAIDs daily.  Will increase telmisartan to 40 mg if Home readings have been above 130/80.  She has been asked to continue to check her  BP  at home and  submit readings for evaluation. Renal function, electrolytes and screen for proteinuria are all normal    Lab Results  Component Value Date   CREATININE 0.71 06/19/2020   Lab Results  Component Value Date   NA 137 06/19/2020   K 4.2 06/19/2020   CL 99 06/19/2020   CO2 30 06/19/2020   Lab Results  Component Value Date   MICROALBUR 1.5 06/19/2020   MICROALBUR <0.7 03/08/2019

## 2020-06-21 NOTE — Assessment & Plan Note (Signed)
Managed with low dose ALPRAZOLAM.  The risks and benefits of benzodiazepine use were reviewed with patient today including excessive sedation leading to respiratory depression,  impaired thinking/driving, and addiction.  Patient was advised to avoid concurrent use with alcohol, to use medication only as needed and not to share with others  .

## 2020-06-21 NOTE — Assessment & Plan Note (Signed)

## 2020-06-23 ENCOUNTER — Encounter: Payer: Self-pay | Admitting: Internal Medicine

## 2020-06-23 LAB — CYTOLOGY - PAP
Comment: NEGATIVE
Diagnosis: NEGATIVE
High risk HPV: NEGATIVE

## 2020-06-23 NOTE — Progress Notes (Signed)
Natalie Pugh,  Your  PAP smear was HPV negative but the endocervical zone was not sampled due to "atrophy" (which occurs to all women some time after menopause). I recommend repeating the PAP in one year.  Regards,   Duncan Dull, MD

## 2020-06-27 ENCOUNTER — Other Ambulatory Visit: Payer: Self-pay | Admitting: Internal Medicine

## 2020-07-14 ENCOUNTER — Telehealth: Payer: Self-pay | Admitting: Internal Medicine

## 2020-07-14 ENCOUNTER — Other Ambulatory Visit: Payer: Self-pay | Admitting: Internal Medicine

## 2020-07-14 DIAGNOSIS — I10 Essential (primary) hypertension: Secondary | ICD-10-CM

## 2020-07-14 MED ORDER — TELMISARTAN-HCTZ 40-12.5 MG PO TABS: 1.0000 | ORAL_TABLET | Freq: Every day | ORAL | 0 refills | Status: DC

## 2020-07-14 NOTE — Telephone Encounter (Signed)
Patient dropped off a health wellness screening form to be completed by Dr. Darrick Huntsman. Forms are up front in Dr. Melina Schools color folder. Please call patient when ready for pick up. Thanks

## 2020-07-14 NOTE — Telephone Encounter (Signed)
NEEDS RN VISIT FOR BP CHECK AND LAB VISIT IN ABOUT A WEEK AFTER MED CHANGE THANKS

## 2020-07-14 NOTE — Assessment & Plan Note (Signed)
NO CHANGE per patient win increase in telmisartan to 40 mg.  ADDING HCTZ 125 MG   RN VISIT AND BMET NEEDED ONE WEEK

## 2020-07-15 NOTE — Telephone Encounter (Signed)
Pt is aware.  

## 2020-07-15 NOTE — Telephone Encounter (Signed)
Yes  She can ctry that

## 2020-07-15 NOTE — Telephone Encounter (Signed)
Spoke with pt and scheduled nurse visit and lab visit. Pt wanted to know if she could take the telmisartan/hctz at night and continue with the metoprolol in the morning unless the diuretic makes her have to go to the bathroom to much during the night. If that happens can she switch them and take the telimsartan/hctz in the morning and the metoprolol at night?

## 2020-07-17 DIAGNOSIS — Z0279 Encounter for issue of other medical certificate: Secondary | ICD-10-CM

## 2020-07-17 NOTE — Telephone Encounter (Signed)
Completed and placed in quick sign folder for signature.

## 2020-07-24 ENCOUNTER — Other Ambulatory Visit: Payer: Self-pay

## 2020-07-24 ENCOUNTER — Ambulatory Visit (INDEPENDENT_AMBULATORY_CARE_PROVIDER_SITE_OTHER): Payer: 59

## 2020-07-24 DIAGNOSIS — I10 Essential (primary) hypertension: Secondary | ICD-10-CM | POA: Diagnosis not present

## 2020-07-24 LAB — BASIC METABOLIC PANEL
BUN: 12 mg/dL (ref 6–23)
CO2: 27 mEq/L (ref 19–32)
Calcium: 9.3 mg/dL (ref 8.4–10.5)
Chloride: 91 mEq/L — ABNORMAL LOW (ref 96–112)
Creatinine, Ser: 0.66 mg/dL (ref 0.40–1.20)
GFR: 98 mL/min (ref >60.00)
Glucose, Bld: 82 mg/dL (ref 70–99)
Potassium: 4 mEq/L (ref 3.5–5.1)
Sodium: 128 mEq/L — ABNORMAL LOW (ref 135–145)

## 2020-07-24 NOTE — Progress Notes (Signed)
Patient is here for a BP check due to bp being high at last visit, as per patient.  Currently patients BP is 120/84 and BPM is 69. Patients machine showed BP is 113/82 and BPM is 70. Patient has no complaints of headaches, blurry vision, chest pain, arm pain, light headedness, dizziness, and nor jaw pain. Please see previous note for order.

## 2020-07-25 ENCOUNTER — Telehealth: Payer: Self-pay | Admitting: Internal Medicine

## 2020-07-25 NOTE — Telephone Encounter (Signed)
Patient called in wanted her last blood pressure read put on her bio matrix sheet

## 2020-07-25 NOTE — Telephone Encounter (Signed)
LMTCB

## 2020-07-25 NOTE — Telephone Encounter (Signed)
Patient was returning call about bio matrix sheet

## 2020-07-27 ENCOUNTER — Other Ambulatory Visit: Payer: Self-pay | Admitting: Internal Medicine

## 2020-07-27 DIAGNOSIS — E871 Hypo-osmolality and hyponatremia: Secondary | ICD-10-CM | POA: Insufficient documentation

## 2020-07-27 DIAGNOSIS — T50905A Adverse effect of unspecified drugs, medicaments and biological substances, initial encounter: Secondary | ICD-10-CM | POA: Insufficient documentation

## 2020-07-27 DIAGNOSIS — I10 Essential (primary) hypertension: Secondary | ICD-10-CM

## 2020-07-27 NOTE — Assessment & Plan Note (Signed)
Improved control with telmisartan hct,  But new onset hyponatremia noted.  Repeat one week .

## 2020-07-28 NOTE — Telephone Encounter (Signed)
Spoke with pt and added the most recent bp reading to her form and that it is ready to be picked up. Pt stated that the she will pick it up when she comes in for lab work next week.

## 2020-08-05 ENCOUNTER — Other Ambulatory Visit (INDEPENDENT_AMBULATORY_CARE_PROVIDER_SITE_OTHER): Payer: 59

## 2020-08-05 ENCOUNTER — Other Ambulatory Visit: Payer: Self-pay

## 2020-08-05 DIAGNOSIS — E871 Hypo-osmolality and hyponatremia: Secondary | ICD-10-CM | POA: Diagnosis not present

## 2020-08-05 LAB — BASIC METABOLIC PANEL
BUN: 12 mg/dL (ref 6–23)
CO2: 29 mEq/L (ref 19–32)
Calcium: 9.7 mg/dL (ref 8.4–10.5)
Chloride: 97 mEq/L (ref 96–112)
Creatinine, Ser: 0.68 mg/dL (ref 0.40–1.20)
GFR: 97.27 mL/min (ref >60.00)
Glucose, Bld: 102 mg/dL — ABNORMAL HIGH (ref 70–99)
Potassium: 4.4 mEq/L (ref 3.5–5.1)
Sodium: 133 mEq/L — ABNORMAL LOW (ref 135–145)

## 2020-08-08 ENCOUNTER — Other Ambulatory Visit: Payer: Self-pay | Admitting: Internal Medicine

## 2020-09-22 ENCOUNTER — Telehealth: Payer: Self-pay | Admitting: Internal Medicine

## 2020-09-22 MED ORDER — TELMISARTAN-HCTZ 40-12.5 MG PO TABS: 1.0000 | ORAL_TABLET | Freq: Every day | ORAL | 2 refills | Status: DC

## 2020-09-22 NOTE — Telephone Encounter (Signed)
Patient called in need refill for telmisartan-hydrochlorothiazide (MICARDIS HCT) 40-12.5 MG tablet she wants it sent to [facility] mail order for 90 days refill

## 2021-01-19 ENCOUNTER — Ambulatory Visit (INDEPENDENT_AMBULATORY_CARE_PROVIDER_SITE_OTHER): Payer: 59 | Admitting: Internal Medicine

## 2021-01-19 ENCOUNTER — Encounter: Payer: Self-pay | Admitting: Internal Medicine

## 2021-01-19 ENCOUNTER — Other Ambulatory Visit: Payer: Self-pay

## 2021-01-19 VITALS — BP 130/82 | HR 81 | Temp 95.9°F | Ht 65.0 in | Wt 206.2 lb

## 2021-01-19 DIAGNOSIS — G35A Relapsing-remitting multiple sclerosis: Secondary | ICD-10-CM

## 2021-01-19 DIAGNOSIS — I10 Essential (primary) hypertension: Secondary | ICD-10-CM

## 2021-01-19 DIAGNOSIS — F411 Generalized anxiety disorder: Secondary | ICD-10-CM

## 2021-01-19 DIAGNOSIS — R5383 Other fatigue: Secondary | ICD-10-CM

## 2021-01-19 DIAGNOSIS — G35 Multiple sclerosis: Secondary | ICD-10-CM

## 2021-01-19 DIAGNOSIS — R7303 Prediabetes: Secondary | ICD-10-CM

## 2021-01-19 DIAGNOSIS — E669 Obesity, unspecified: Secondary | ICD-10-CM

## 2021-01-19 DIAGNOSIS — Z1231 Encounter for screening mammogram for malignant neoplasm of breast: Secondary | ICD-10-CM

## 2021-01-19 DIAGNOSIS — R351 Nocturia: Secondary | ICD-10-CM

## 2021-01-19 DIAGNOSIS — F5101 Primary insomnia: Secondary | ICD-10-CM

## 2021-01-19 LAB — COMPREHENSIVE METABOLIC PANEL
ALT: 15 U/L (ref 0–35)
AST: 18 U/L (ref 0–37)
Albumin: 4 g/dL (ref 3.5–5.2)
Alkaline Phosphatase: 61 U/L (ref 39–117)
BUN: 12 mg/dL (ref 6–23)
CO2: 28 mEq/L (ref 19–32)
Calcium: 9.5 mg/dL (ref 8.4–10.5)
Chloride: 98 mEq/L (ref 96–112)
Creatinine, Ser: 0.65 mg/dL (ref 0.40–1.20)
GFR: 98.02 mL/min (ref >60.00)
Glucose, Bld: 92 mg/dL (ref 70–99)
Potassium: 4.1 mEq/L (ref 3.5–5.1)
Sodium: 134 mEq/L — ABNORMAL LOW (ref 135–145)
Total Bilirubin: 0.6 mg/dL (ref 0.2–1.2)
Total Protein: 6.8 g/dL (ref 6.0–8.3)

## 2021-01-19 LAB — HEMOGLOBIN A1C: Hgb A1c MFr Bld: 5.9 % (ref 4.6–6.5)

## 2021-01-19 MED ORDER — ALPRAZOLAM 0.5 MG PO TABS: 1.0000 mg | ORAL_TABLET | Freq: Two times a day (BID) | ORAL | 5 refills | Status: DC | PRN

## 2021-01-19 NOTE — Progress Notes (Signed)
Subjective:  Patient ID: Natalie Pugh, female    DOB: 1965-12-24  Age: 55 y.o. MRN: [id number]  CC: The primary encounter diagnosis was Encounter for screening mammogram for malignant neoplasm of breast. Diagnoses of Primary insomnia, Nocturia, Prediabetes, Fatigue, unspecified type, Multiple sclerosis, relapsing-remitting (HCC), Elevated blood pressure reading in office with white coat syndrome, with diagnosis of hypertension, Obesity (BMI 30-39.9), and Generalized anxiety disorder were also pertinent to this visit.  HPI Natalie Pugh presents for  Chief Complaint  Patient presents with   Follow-up    6 month follow up    Insomnia:  using 1/2 alprazolam at night , alternates with benadryl but side effects too sedating in the morning .  Occasionally uses another 1/2 tablet for anxiety about leaving the house.  Still bothered by nocturia x 3 S  MS:  stopped the Rebif due to side effects occurring the next day  and length of therapy x 13 years.  Still walking   Neurogenic bladder: self catheterizes ,  taking the   HTN:  taking telmisartan hct at night because of urinary incontinence. Home readings 110/75 or lower  Has a full house right now.    Outpatient Medications Prior to Visit  Medication Sig Dispense Refill   Cholecalciferol (VITAMIN D3) 1000 UNITS CAPS Take 2 capsules by mouth daily.      Cranberry 250 MG CAPS Take 2 capsules by mouth daily.     escitalopram (LEXAPRO) 10 MG tablet TAKE 1 TABLET BY MOUTH  DAILY 90 tablet 3   metoprolol succinate (TOPROL-XL) 50 MG 24 hr tablet TAKE 1 TABLET BY MOUTH  DAILY 90 tablet 3   Multiple Vitamin (MULTIVITAMIN) tablet Take 1 tablet by mouth daily.     oxybutynin (DITROPAN-XL) 5 MG 24 hr tablet Take 1 tablet (5 mg total) by mouth daily as needed. 90 tablet 1   telmisartan-hydrochlorothiazide (MICARDIS HCT) 40-12.5 MG tablet Take 1 tablet by mouth daily. 90 tablet 2   ALPRAZolam (XANAX) 0.5 MG tablet Take 2 tablets (1 mg total) by  mouth 2 (two) times daily as needed for anxiety or sleep. 60 tablet 2   Magnesium 200 MG TABS Take 1 tablet by mouth daily. (Patient not taking: Reported on 01/19/2021)     REBIF 44 MCG/0.5ML injection INJECT 1 PREFILLED SYRINGE SUBCUTANEOUSLY 3 TIMES PER WEEK AS DIRECTED (Patient not taking: Reported on 01/19/2021) 18 mL 0   telmisartan (MICARDIS) 20 MG tablet TAKE 1 TABLET(20 MG) BY MOUTH DAILY 90 tablet 1   No facility-administered medications prior to visit.    Review of Systems;  Patient denies headache, fevers, malaise, unintentional weight loss, skin rash, eye pain, sinus congestion and sinus pain, sore throat, dysphagia,  hemoptysis , cough, dyspnea, wheezing, chest pain, palpitations, orthopnea, edema, abdominal pain, nausea, melena, diarrhea, constipation, flank pain, dysuria, hematuria, urinary  Frequency, nocturia, numbness, tingling, seizures,  Focal weakness, Loss of consciousness,  Tremor, insomnia, depression, anxiety, and suicidal ideation.      Objective:  BP 130/82 (BP Location: Left Arm, Patient Position: Sitting, Cuff Size: Normal)   Pulse 81   Temp (!) 95.9 F (35.5 C) (Temporal)   Ht 5\' 5"  (1.651 m)   Wt 206 lb 3.2 oz (93.5 kg)   LMP 09/28/2014 (Exact Date)   SpO2 97%   BMI 34.31 kg/m   BP Readings from Last 3 Encounters:  01/19/21 130/82  07/24/20 120/84  06/21/20 140/80    Wt Readings from Last 3 Encounters:  01/19/21 206 lb  3.2 oz (93.5 kg)  06/19/20 199 lb 9.6 oz (90.5 kg)  09/13/19 196 lb 3.2 oz (89 kg)    General appearance: alert, cooperative and appears stated age Ears: normal TM's and external ear canals both ears Throat: lips, mucosa, and tongue normal; teeth and gums normal Neck: no adenopathy, no carotid bruit, supple, symmetrical, trachea midline and thyroid not enlarged, symmetric, no tenderness/mass/nodules Back: symmetric, no curvature. ROM normal. No CVA tenderness. Lungs: clear to auscultation bilaterally Heart: regular rate and  rhythm, S1, S2 normal, no murmur, click, rub or gallop Abdomen: soft, non-tender; bowel sounds normal; no masses,  no organomegaly Pulses: 2+ and symmetric Skin: Skin color, texture, turgor normal. No rashes or lesions Lymph nodes: Cervical, supraclavicular, and axillary nodes normal.  Lab Results  Component Value Date   HGBA1C 6.0 06/19/2020   HGBA1C 6.0 09/13/2019   HGBA1C 5.9 03/08/2019    Lab Results  Component Value Date   CREATININE 0.68 08/05/2020   CREATININE 0.66 07/24/2020   CREATININE 0.71 06/19/2020    Lab Results  Component Value Date   WBC 3.7 (L) 03/25/2017   HGB 12.4 03/25/2017   HCT 37.7 03/25/2017   PLT 227.0 03/25/2017   GLUCOSE 102 (H) 08/05/2020   CHOL 205 (H) 06/19/2020   TRIG 92.0 06/19/2020   HDL 58.00 06/19/2020   LDLDIRECT 107.0 03/25/2017   LDLCALC 128 (H) 06/19/2020   ALT 16 06/19/2020   AST 19 06/19/2020   NA 133 (L) 08/05/2020   K 4.4 08/05/2020   CL 97 08/05/2020   CREATININE 0.68 08/05/2020   BUN 12 08/05/2020   CO2 29 08/05/2020   TSH 1.10 06/19/2020   HGBA1C 6.0 06/19/2020   MICROALBUR 1.5 06/19/2020    No results found.  Assessment & Plan:   Problem List Items Addressed This Visit       Unprioritized   Multiple sclerosis, relapsing-remitting (HCC)    She has stopped interferon therapy (Rebif) after 13 yrs .  encouaraged to increase exercise and conditioning      Elevated blood pressure reading in office with white coat syndrome, with diagnosis of hypertension    Home readings have all been < 120/80 on current regimen of metoprolol and telmisartan/hct.  No changes today      Obesity (BMI 30-39.9)    I have addressed  BMI and recommended a low glycemic index diet utilizing smaller more frequent meals to increase metabolism.  I have also recommended that patient start exercising with a goal of 30 minutes of aerobic exercise a minimum of 5 days per week.if A1c has risen to 6.0 or higher will discuss adding ozempic or  mounjaro      Prediabetes   Relevant Orders   Hemoglobin A1c   Comprehensive metabolic panel   Comprehensive metabolic panel   Lipid panel   Hemoglobin A1c   Generalized anxiety disorder    Symptoms are controlled with daily use of lexapro and prn use of 0.25 mg alprazolam( 1/2 tablet) The risks and benefits of benzodiazepine use were discussed with patient today including excessive sedation leading to respiratory depression,  impaired thinking/driving, and addiction.  Patient was advised to avoid concurrent use with alcohol, to use medication only as needed and not to share with others  .       Relevant Medications   ALPRAZolam (XANAX) 0.5 MG tablet   Fatigue   Relevant Orders   TSH   Primary insomnia   Relevant Medications   ALPRAZolam (XANAX) 0.5 MG tablet  Nocturia    Due to neurogenic bladder and use of hctz at night to avoid daytime incontinence when outside of the home       Other Visit Diagnoses     Encounter for screening mammogram for malignant neoplasm of breast    -  Primary   Relevant Orders   MM 3D SCREEN BREAST BILATERAL      I spent 30 mintutes dedicated to the care of this patient on the date of this encounter to include pre-visit review of his medical history,  Face-to-face time with the patient , and post visit ordering of testing and therapeutics.  Meds ordered this encounter  Medications   ALPRAZolam (XANAX) 0.5 MG tablet    Sig: Take 2 tablets (1 mg total) by mouth 2 (two) times daily as needed for anxiety or sleep.    Dispense:  60 tablet    Refill:  5    Medications Discontinued During This Encounter  Medication Reason   telmisartan (MICARDIS) 20 MG tablet Change in therapy   Magnesium 200 MG TABS    REBIF 44 MCG/0.5ML injection    ALPRAZolam (XANAX) 0.5 MG tablet Reorder    Follow-up: Return in about 6 months (around 07/19/2021).   Sherlene Shams, MD

## 2021-01-19 NOTE — Assessment & Plan Note (Signed)
She has stopped interferon therapy (Rebif) after 13 yrs .  encouaraged to increase exercise and conditioning

## 2021-01-19 NOTE — Assessment & Plan Note (Signed)
Home readings have all been < 120/80 on current regimen of metoprolol and telmisartan/hct.  No changes today

## 2021-01-19 NOTE — Assessment & Plan Note (Signed)
I have addressed  BMI and recommended a low glycemic index diet utilizing smaller more frequent meals to increase metabolism.  I have also recommended that patient start exercising with a goal of 30 minutes of aerobic exercise a minimum of 5 days per week.if A1c has risen to 6.0 or higher will discuss adding ozempic or mounjaro

## 2021-01-19 NOTE — Assessment & Plan Note (Signed)
Symptoms are controlled with daily use of lexapro and prn use of 0.25 mg alprazolam( 1/2 tablet) The risks and benefits of benzodiazepine use were discussed with patient today including excessive sedation leading to respiratory depression,  impaired thinking/driving, and addiction.  Patient was advised to avoid concurrent use with alcohol, to use medication only as needed and not to share with others  .

## 2021-01-19 NOTE — Patient Instructions (Addendum)
Your annual mammogram has NOT BEEN DONE SINCE 2019!  It has been ordered.  You are encouraged (required) to call to make your appointment at [facility]  [phone]  (they won't let us do it)   Don't worry about yesterday or tomorrow:  today has enough worries of its own.Marland KitchenMarland Kitchen

## 2021-01-19 NOTE — Assessment & Plan Note (Signed)
Due to neurogenic bladder and use of hctz at night to avoid daytime incontinence when outside of the home

## 2021-03-06 ENCOUNTER — Ambulatory Visit
Admission: RE | Admit: 2021-03-06 | Discharge: 2021-03-06 | Disposition: A | Discharge status: Home / Self Care | Payer: 59 | Source: Ambulatory Visit | Attending: Internal Medicine | Admitting: Internal Medicine

## 2021-03-06 ENCOUNTER — Other Ambulatory Visit: Payer: Self-pay

## 2021-03-06 DIAGNOSIS — Z1231 Encounter for screening mammogram for malignant neoplasm of breast: Secondary | ICD-10-CM | POA: Insufficient documentation

## 2021-04-22 ENCOUNTER — Other Ambulatory Visit: Payer: Self-pay | Admitting: Internal Medicine

## 2021-04-29 ENCOUNTER — Other Ambulatory Visit: Payer: Self-pay | Admitting: Internal Medicine

## 2021-06-22 ENCOUNTER — Encounter: Payer: 59 | Admitting: Internal Medicine

## 2021-07-07 ENCOUNTER — Other Ambulatory Visit: Payer: Self-pay | Admitting: Internal Medicine

## 2021-07-30 ENCOUNTER — Encounter: Payer: Self-pay | Admitting: Internal Medicine

## 2021-07-31 ENCOUNTER — Ambulatory Visit (INDEPENDENT_AMBULATORY_CARE_PROVIDER_SITE_OTHER): Payer: 59 | Admitting: Internal Medicine

## 2021-07-31 ENCOUNTER — Encounter: Payer: Self-pay | Admitting: Internal Medicine

## 2021-07-31 ENCOUNTER — Other Ambulatory Visit (HOSPITAL_COMMUNITY)
Admission: RE | Admit: 2021-07-31 | Discharge: 2021-07-31 | Disposition: A | Discharge status: Home / Self Care | Payer: 59 | Source: Ambulatory Visit | Attending: Internal Medicine | Admitting: Internal Medicine

## 2021-07-31 ENCOUNTER — Other Ambulatory Visit: Payer: Self-pay

## 2021-07-31 VITALS — BP 136/70 | HR 89 | Temp 98.2°F | Ht 65.0 in | Wt 210.0 lb

## 2021-07-31 DIAGNOSIS — E669 Obesity, unspecified: Secondary | ICD-10-CM

## 2021-07-31 DIAGNOSIS — Z124 Encounter for screening for malignant neoplasm of cervix: Secondary | ICD-10-CM | POA: Insufficient documentation

## 2021-07-31 DIAGNOSIS — I10 Essential (primary) hypertension: Secondary | ICD-10-CM | POA: Diagnosis not present

## 2021-07-31 DIAGNOSIS — Z0000 Encounter for general adult medical examination without abnormal findings: Secondary | ICD-10-CM

## 2021-07-31 DIAGNOSIS — E785 Hyperlipidemia, unspecified: Secondary | ICD-10-CM | POA: Diagnosis not present

## 2021-07-31 DIAGNOSIS — G35A Relapsing-remitting multiple sclerosis: Secondary | ICD-10-CM

## 2021-07-31 DIAGNOSIS — Z1231 Encounter for screening mammogram for malignant neoplasm of breast: Secondary | ICD-10-CM | POA: Diagnosis not present

## 2021-07-31 DIAGNOSIS — R5383 Other fatigue: Secondary | ICD-10-CM | POA: Diagnosis not present

## 2021-07-31 DIAGNOSIS — G35 Multiple sclerosis: Secondary | ICD-10-CM

## 2021-07-31 DIAGNOSIS — Z114 Encounter for screening for human immunodeficiency virus [HIV]: Secondary | ICD-10-CM

## 2021-07-31 DIAGNOSIS — R7303 Prediabetes: Secondary | ICD-10-CM | POA: Diagnosis not present

## 2021-07-31 LAB — CBC WITH DIFFERENTIAL/PLATELET
Basophils Absolute: 0 10*3/uL (ref 0.0–0.1)
Basophils Relative: 0.9 % (ref 0.0–3.0)
Eosinophils Absolute: 0.1 10*3/uL (ref 0.0–0.7)
Eosinophils Relative: 2 % (ref 0.0–5.0)
HCT: 39.1 % (ref 36.0–46.0)
Hemoglobin: 13.3 g/dL (ref 12.0–15.0)
Lymphocytes Relative: 25.7 % (ref 12.0–46.0)
Lymphs Abs: 1 10*3/uL (ref 0.7–4.0)
MCHC: 33.9 g/dL (ref 30.0–36.0)
MCV: 92.5 fl (ref 78.0–100.0)
Monocytes Absolute: 0.4 10*3/uL (ref 0.1–1.0)
Monocytes Relative: 9 % (ref 3.0–12.0)
Neutro Abs: 2.5 10*3/uL (ref 1.4–7.7)
Neutrophils Relative %: 62.4 % (ref 43.0–77.0)
Platelets: 301 10*3/uL (ref 150.0–400.0)
RBC: 4.23 Mil/uL (ref 3.87–5.11)
RDW: 12.6 % (ref 11.5–15.5)
WBC: 4 10*3/uL (ref 4.0–10.5)

## 2021-07-31 LAB — COMPREHENSIVE METABOLIC PANEL
ALT: 16 U/L (ref 0–35)
AST: 20 U/L (ref 0–37)
Albumin: 4.3 g/dL (ref 3.5–5.2)
Alkaline Phosphatase: 49 U/L (ref 39–117)
BUN: 16 mg/dL (ref 6–23)
CO2: 28 mEq/L (ref 19–32)
Calcium: 9.4 mg/dL (ref 8.4–10.5)
Chloride: 100 mEq/L (ref 96–112)
Creatinine, Ser: 0.66 mg/dL (ref 0.40–1.20)
GFR: 97.3 mL/min (ref >60.00)
Glucose, Bld: 94 mg/dL (ref 70–99)
Potassium: 4.1 mEq/L (ref 3.5–5.1)
Sodium: 134 mEq/L — ABNORMAL LOW (ref 135–145)
Total Bilirubin: 0.5 mg/dL (ref 0.2–1.2)
Total Protein: 7.2 g/dL (ref 6.0–8.3)

## 2021-07-31 LAB — LIPID PANEL
Cholesterol: 208 mg/dL — ABNORMAL HIGH (ref 0–200)
HDL: 64.5 mg/dL (ref >39.00)
LDL Cholesterol: 127 mg/dL — ABNORMAL HIGH (ref 0–99)
NonHDL: 143.11
Total CHOL/HDL Ratio: 3
Triglycerides: 81 mg/dL (ref 0.0–149.0)
VLDL: 16.2 mg/dL (ref 0.0–40.0)

## 2021-07-31 LAB — MICROALBUMIN / CREATININE URINE RATIO
Creatinine,U: 95.7 mg/dL
Microalb Creat Ratio: 1.4 mg/g (ref 0.0–30.0)
Microalb, Ur: 1.3 mg/dL (ref 0.0–1.9)

## 2021-07-31 LAB — HEMOGLOBIN A1C: Hgb A1c MFr Bld: 5.9 % (ref 4.6–6.5)

## 2021-07-31 MED ORDER — ZOSTER VAC RECOMB ADJUVANTED 50 MCG/0.5ML IM SUSR: 0.5000 mL | Freq: Once | INTRAMUSCULAR | 1 refills | Status: AC

## 2021-07-31 NOTE — Progress Notes (Signed)
The patient is here for annual preventive examination and management of other chronic and acute problems. ? ?This visit occurred during the SARS-CoV-2 public health emergency.  Safety protocols were in place, including screening questions prior to the visit, additional usage of staff PPE, and extensive cleaning of exam room while observing appropriate contact time as indicated for disinfecting solutions.  ? ?  ?The risk factors are reflected in the social history. ?  ?The roster of all physicians providing medical care to patient - is listed in the Snapshot section of the chart. ?  ?Activities of daily living:  The patient is 100% independent in all ADLs: dressing, toileting, feeding as well as independent mobility ?  ?Home safety : The patient has smoke detectors in the home. They wear seatbelts.  There are no unsecured firearms at home. There is no violence in the home.  ?  ?There is no risks for hepatitis, STDs or HIV. There is no   history of blood transfusion. They have no travel history to infectious disease endemic areas of the world. ?  ?The patient has seen their dentist in the last six month. They have seen their eye doctor in the last year.  She denies slight hearing difficulty with regard to whispered voices and some television programs.  They have deferred audiologic testing in the last year.  They do not  have excessive sun exposure. Discussed the need for sun protection: hats, long sleeves and use of sunscreen if there is significant sun exposure.  ?  ?Diet: the importance of a healthy diet is discussed. She has  a healthy diet and she is  working on losing weight y ?  ?The benefits of regular aerobic exercise were discussed. The patient  exercises  3 to 5 days per week  for  60 minutes.  ?  ?Depression screen: there are no signs or vegative symptoms of depression- irritability, change in appetite, anhedonia, sadness/tearfullness. ?  ?The following portions of the patient's history were reviewed and  updated as appropriate: allergies, current medications, past family history, past medical history,  past surgical history, past social history  and problem list. ?  ?Visual acuity was not assessed per patient preference since the patient has regular follow up with an  ophthalmologist. Hearing and body mass index were assessed and reviewed.  ?  ?During the course of the visit the patient was educated and counseled about appropriate screening and preventive services including : fall prevention , diabetes screening, nutrition counseling, colorectal cancer screening, and recommended immunizations.   ? ?Chief Complaint: ? ?1) history of paronychia requiring incision & drainage Dec 2022.  Left thumb ,  drained,  septra prescribed by [facility] ? ?2) venous insufficiency left foot  ? ?3) MS:  using walking sticks.  Not walking daiy bur recognizes need to do so ? ?4) self catheterizember ?  ? ? ?Review of Symptoms ? ?Patient denies headache, fevers, malaise, unintentional weight loss, skin rash, eye pain, sinus congestion and sinus pain, sore throat, dysphagia,  hemoptysis , cough, dyspnea, wheezing, chest pain, palpitations, orthopnea, edema, abdominal pain, nausea, melena, diarrhea, constipation, flank pain, dysuria, hematuria, urinary  Frequency, nocturia, numbness, tingling, seizures,  Focal weakness, Loss of consciousness,  Tremor, insomnia, depression, anxiety, and suicidal ideation.   ? ?Physical Exam: ? ?BP 136/70 (BP Location: Left Arm, Patient Position: Sitting, Cuff Size: Large)   Pulse 89   Temp 98.2 ?F (36.8 ?C) (Oral)   Ht 5\' 5"  (1.651 m)   Wt  210 lb (95.3 kg)   LMP 09/28/2014 (Exact Date)   SpO2 98%   BMI 34.95 kg/m?   ? ?General Appearance:    Alert, cooperative, no distress, appears stated age  ?Head:    Normocephalic, without obvious abnormality, atraumatic  ?Eyes:    PERRL, conjunctiva/corneas clear, EOM's intact, fundi  ?  benign, both eyes  ?Ears:    Normal TM's and external ear canals, both ears   ?Nose:   Nares normal, septum midline, mucosa normal, no drainage  ?  or sinus tenderness  ?Throat:   Lips, mucosa, and tongue normal; teeth and gums normal  ?Neck:   Supple, symmetrical, trachea midline, no adenopathy;  ?  thyroid:  no enlargement/tenderness/nodules; no carotid ?  bruit or JVD  ?Back:     Symmetric, no curvature, ROM normal, no CVA tenderness  ?Lungs:     Clear to auscultation bilaterally, respirations unlabored  ?Chest Wall:    No tenderness or deformity  ? Heart:    Regular rate and rhythm, S1 and S2 normal, no murmur, rub ?  or gallop  ?Breast Exam:    No tenderness, masses, or nipple abnormality  ?Abdomen:     Soft, non-tender, bowel sounds active all four quadrants,  ?  no masses, no organomegaly  ?Genitalia:    Pelvic: cervix normal in appearance, external genitalia normal, no adnexal masses or tenderness, no cervical motion tenderness, rectovaginal septum normal, uterus normal size, shape, and consistency and vagina normal without discharge  ?Extremities:   Extremities normal, atraumatic, no cyanosis or edema  ?Pulses:   2+ and symmetric all extremities  ?Skin:   Skin color, texture, turgor normal, no rashes or lesions  ?Lymph nodes:   Cervical, supraclavicular, and axillary nodes normal  ?Neurologic:   CNII-XII intact, normal strength, sensation and reflexes  ?  throughout  ?  ? ? ?Assessment and Plan: ? ?Multiple sclerosis, relapsing-remitting (HCC) ?Currently untreated by choice.  Stopped Rebiff.  Follow up in May with Dr Sherryll Burger.  Last MRI 2018 an drecommended last year,  But her deductible was too high.  Encouraged to walk daily  ? ?Obesity (BMI 30-39.9) ?I have addressed  BMI and recommended a low glycemic index diet utilizing smaller more frequent meals to increase metabolism.  I have also recommended that patient start exercising with a goal of 30 minutes of aerobic exercise a minimum of 5 days per week ? ?Encounter for preventive health examination ?age appropriate education and  counseling updated, referrals for preventative services and immunizations addressed, dietary and smoking counseling addressed, most recent labs reviewed.  I have personally reviewed and have noted: ?  ?1) the patient's medical and social history ?2) The pt's use of alcohol, tobacco, and illicit drugs ?3) The patient's current medications and supplements ?4) Functional ability including ADL's, fall risk, home safety risk, hearing and visual impairment ?5) Diet and physical activities ?6) Evidence for depression or mood disorder ?7) The patient's height, weight, and BMI have been recorded in the chart  ?  ?I have made referrals, and provided counseling and education based on review of the above ? ?Elevated blood pressure reading in office with white coat syndrome, with diagnosis of hypertension ?Home readings have all been < 120/80 on current regimen of metoprolol and telmisartan/hct.  No changes today ? ?Prediabetes ?A1c   is stable.  Weight gain addressed.  I reviewed  her diet and exercise regimen, providing  counselling about the the role of diet and exercise  In preventing  progression from prediabetes to diabetes,and making suggestions on how to modify her current lifestyle.    ? ?Lab Results  ?Component Value Date  ? HGBA1C 5.9 01/19/2021  ? ? ? ?Updated Medication List ?Outpatient Encounter Medications as of 07/31/2021  ?Medication Sig  ? [EXPIRED] Zoster Vaccine Adjuvanted [facility]) injection Inject 0.5 mLs into the muscle once for 1 dose.  ? ALPRAZolam (XANAX) 0.5 MG tablet Take 2 tablets (1 mg total) by mouth 2 (two) times daily as needed for anxiety or sleep.  ? Cholecalciferol (VITAMIN D3) 1000 UNITS CAPS Take 2 capsules by mouth daily.   ? Cranberry 250 MG CAPS Take 2 capsules by mouth daily.  ? escitalopram (LEXAPRO) 10 MG tablet TAKE 1 TABLET BY MOUTH  DAILY  ? metoprolol succinate (TOPROL-XL) 50 MG 24 hr tablet TAKE 1 TABLET BY MOUTH  DAILY  ? Multiple Vitamin (MULTIVITAMIN) tablet Take 1 tablet by  mouth daily.  ? oxybutynin (DITROPAN-XL) 5 MG 24 hr tablet Take 1 tablet (5 mg total) by mouth daily as needed.  ? telmisartan-hydrochlorothiazide (MICARDIS HCT) 40-12.5 MG tablet TAKE 1 TABLET BY MOUTH  DAILY  ? ?No

## 2021-07-31 NOTE — Assessment & Plan Note (Addendum)
Currently untreated by choice.  Stopped Rebiff.  Follow up in May with Dr Manuella Ghazi.  Last MRI 2018 an drecommended last year,  But her deductible was too high.  Encouraged to walk daily  ?

## 2021-07-31 NOTE — Patient Instructions (Addendum)
Your annual mammogram is not due until  November , but it  has been ordered.  You are encouraged (required) to call to make your appointment at [facility]  [phone] ? ? ? ?The ShingRx vaccine is now available either here in the office or n your local pharmacy and is much more protective than the old one  Zostavax  (it is about 97%  Effective in preventing shingles). .   It is a killed viral vaccine  It is therefore ADVISED for all interested adults over 50 to prevent shingles so I have printed you a prescription for it.  (it requires a 2nd dose 2 to 6 months after the first one) .  It will cause you to have flu  like symptoms for 2 days   ? ?Don't let your MS keep  you from Walking.  Consider the upright walker if you need more stability than the walking sticks ? ? ?Glycerin suppositories  ?

## 2021-08-02 NOTE — Assessment & Plan Note (Signed)

## 2021-08-02 NOTE — Assessment & Plan Note (Signed)
Home readings have all been < 120/80 on current regimen of metoprolol and telmisartan/hct.  No changes today 

## 2021-08-02 NOTE — Assessment & Plan Note (Signed)
I have addressed  BMI and recommended a low glycemic index diet utilizing smaller more frequent meals to increase metabolism.  I have also recommended that patient start exercising with a goal of 30 minutes of aerobic exercise a minimum of 5 days per week.  

## 2021-08-02 NOTE — Assessment & Plan Note (Signed)
A1c   is stable.  Weight gain addressed.  I reviewed  her diet and exercise regimen, providing  counselling about the the role of diet and exercise  In preventing progression from prediabetes to diabetes,and making suggestions on how to modify her current lifestyle.    ? ?Lab Results  ?Component Value Date  ? HGBA1C 5.9 01/19/2021  ? ? ?

## 2021-08-03 LAB — CYTOLOGY - PAP
Comment: NEGATIVE
Diagnosis: NEGATIVE
High risk HPV: NEGATIVE

## 2021-08-03 LAB — HIV ANTIBODY (ROUTINE TESTING W REFLEX): HIV 1&2 Ab, 4th Generation: NONREACTIVE

## 2021-08-04 LAB — TSH: TSH: 0.96 u[IU]/mL (ref 0.35–5.50)

## 2021-09-18 ENCOUNTER — Telehealth: Payer: Self-pay | Admitting: Internal Medicine

## 2021-09-18 NOTE — Telephone Encounter (Signed)
Pt son came into office to drop off screening form for provider to fill out. Placed In provider folder ?

## 2021-09-21 NOTE — Telephone Encounter (Signed)
Completed and placed in quick sign folder.  

## 2021-09-24 NOTE — Telephone Encounter (Signed)
Form has been completed and placed up front for pick up. Pt is aware.  

## 2022-02-03 ENCOUNTER — Ambulatory Visit: Payer: 59 | Admitting: Internal Medicine

## 2022-03-31 ENCOUNTER — Other Ambulatory Visit: Payer: Self-pay | Admitting: Internal Medicine

## 2022-09-01 ENCOUNTER — Encounter: Payer: Self-pay | Admitting: Internal Medicine

## 2022-09-01 ENCOUNTER — Ambulatory Visit (INDEPENDENT_AMBULATORY_CARE_PROVIDER_SITE_OTHER): Payer: 59 | Admitting: Internal Medicine

## 2022-09-01 VITALS — BP 110/81 | HR 67 | Temp 97.4°F | Ht 65.0 in | Wt 203.0 lb

## 2022-09-01 DIAGNOSIS — F411 Generalized anxiety disorder: Secondary | ICD-10-CM

## 2022-09-01 DIAGNOSIS — R7303 Prediabetes: Secondary | ICD-10-CM | POA: Diagnosis not present

## 2022-09-01 DIAGNOSIS — Z0000 Encounter for general adult medical examination without abnormal findings: Secondary | ICD-10-CM

## 2022-09-01 DIAGNOSIS — Z1231 Encounter for screening mammogram for malignant neoplasm of breast: Secondary | ICD-10-CM | POA: Diagnosis not present

## 2022-09-01 DIAGNOSIS — I872 Venous insufficiency (chronic) (peripheral): Secondary | ICD-10-CM

## 2022-09-01 DIAGNOSIS — E669 Obesity, unspecified: Secondary | ICD-10-CM

## 2022-09-01 DIAGNOSIS — I10 Essential (primary) hypertension: Secondary | ICD-10-CM | POA: Diagnosis not present

## 2022-09-01 DIAGNOSIS — E785 Hyperlipidemia, unspecified: Secondary | ICD-10-CM | POA: Diagnosis not present

## 2022-09-01 DIAGNOSIS — G35 Multiple sclerosis: Secondary | ICD-10-CM

## 2022-09-01 DIAGNOSIS — E871 Hypo-osmolality and hyponatremia: Secondary | ICD-10-CM

## 2022-09-01 DIAGNOSIS — F5101 Primary insomnia: Secondary | ICD-10-CM

## 2022-09-01 DIAGNOSIS — R5383 Other fatigue: Secondary | ICD-10-CM | POA: Diagnosis not present

## 2022-09-01 DIAGNOSIS — H9313 Tinnitus, bilateral: Secondary | ICD-10-CM

## 2022-09-01 DIAGNOSIS — N319 Neuromuscular dysfunction of bladder, unspecified: Secondary | ICD-10-CM

## 2022-09-01 DIAGNOSIS — R351 Nocturia: Secondary | ICD-10-CM | POA: Diagnosis not present

## 2022-09-01 DIAGNOSIS — R3989 Other symptoms and signs involving the genitourinary system: Secondary | ICD-10-CM

## 2022-09-01 LAB — LIPID PANEL
Cholesterol: 218 mg/dL — ABNORMAL HIGH (ref 0–200)
HDL: 63.9 mg/dL (ref >39.00)
LDL Cholesterol: 129 mg/dL — ABNORMAL HIGH (ref 0–99)
NonHDL: 154.09
Total CHOL/HDL Ratio: 3
Triglycerides: 124 mg/dL (ref 0.0–149.0)
VLDL: 24.8 mg/dL (ref 0.0–40.0)

## 2022-09-01 LAB — URINALYSIS, ROUTINE W REFLEX MICROSCOPIC
Bilirubin Urine: NEGATIVE
Hgb urine dipstick: NEGATIVE
Ketones, ur: NEGATIVE
Nitrite: NEGATIVE
Specific Gravity, Urine: 1.01 (ref 1.000–1.030)
Total Protein, Urine: NEGATIVE
Urine Glucose: NEGATIVE
Urobilinogen, UA: 0.2 (ref 0.0–1.0)
pH: 7 (ref 5.0–8.0)

## 2022-09-01 LAB — MICROALBUMIN / CREATININE URINE RATIO
Creatinine,U: 66.7 mg/dL
Microalb Creat Ratio: 1 mg/g (ref 0.0–30.0)
Microalb, Ur: <0.7 mg/dL (ref 0.0–1.9)

## 2022-09-01 LAB — LDL CHOLESTEROL, DIRECT: Direct LDL: 131 mg/dL

## 2022-09-01 LAB — CBC WITH DIFFERENTIAL/PLATELET
Basophils Absolute: 0 10*3/uL (ref 0.0–0.1)
Basophils Relative: 0.7 % (ref 0.0–3.0)
Eosinophils Absolute: 0.1 10*3/uL (ref 0.0–0.7)
Eosinophils Relative: 1.7 % (ref 0.0–5.0)
HCT: 40.7 % (ref 36.0–46.0)
Hemoglobin: 13.7 g/dL (ref 12.0–15.0)
Lymphocytes Relative: 23.1 % (ref 12.0–46.0)
Lymphs Abs: 1.1 10*3/uL (ref 0.7–4.0)
MCHC: 33.6 g/dL (ref 30.0–36.0)
MCV: 93.5 fl (ref 78.0–100.0)
Monocytes Absolute: 0.5 10*3/uL (ref 0.1–1.0)
Monocytes Relative: 10.5 % (ref 3.0–12.0)
Neutro Abs: 3.2 10*3/uL (ref 1.4–7.7)
Neutrophils Relative %: 64 % (ref 43.0–77.0)
Platelets: 309 10*3/uL (ref 150.0–400.0)
RBC: 4.35 Mil/uL (ref 3.87–5.11)
RDW: 12.6 % (ref 11.5–15.5)
WBC: 5 10*3/uL (ref 4.0–10.5)

## 2022-09-01 LAB — HEMOGLOBIN A1C: Hgb A1c MFr Bld: 5.8 % (ref 4.6–6.5)

## 2022-09-01 LAB — COMPREHENSIVE METABOLIC PANEL
ALT: 18 U/L (ref 0–35)
AST: 21 U/L (ref 0–37)
Albumin: 4.3 g/dL (ref 3.5–5.2)
Alkaline Phosphatase: 47 U/L (ref 39–117)
BUN: 16 mg/dL (ref 6–23)
CO2: 29 mEq/L (ref 19–32)
Calcium: 9.6 mg/dL (ref 8.4–10.5)
Chloride: 96 mEq/L (ref 96–112)
Creatinine, Ser: 0.69 mg/dL (ref 0.40–1.20)
GFR: 95.53 mL/min (ref >60.00)
Glucose, Bld: 88 mg/dL (ref 70–99)
Potassium: 4.3 mEq/L (ref 3.5–5.1)
Sodium: 134 mEq/L — ABNORMAL LOW (ref 135–145)
Total Bilirubin: 0.6 mg/dL (ref 0.2–1.2)
Total Protein: 7 g/dL (ref 6.0–8.3)

## 2022-09-01 LAB — TSH: TSH: 0.96 u[IU]/mL (ref 0.35–5.50)

## 2022-09-01 MED ORDER — METOPROLOL SUCCINATE ER 50 MG PO TB24: 50.0000 mg | ORAL_TABLET | Freq: Every day | ORAL | 1 refills | Status: DC

## 2022-09-01 MED ORDER — TELMISARTAN-HCTZ 40-12.5 MG PO TABS: 1.0000 | ORAL_TABLET | Freq: Every day | ORAL | 1 refills | Status: DC

## 2022-09-01 MED ORDER — ALPRAZOLAM 0.5 MG PO TABS
1.0000 mg | ORAL_TABLET | Freq: Two times a day (BID) | ORAL | 5 refills | Status: DC | PRN
Start: 2022-09-01 — End: 2023-10-04

## 2022-09-01 MED ORDER — ESTRADIOL 0.1 MG/GM VA CREA: 1.0000 | TOPICAL_CREAM | Freq: Every day | VAGINAL | 12 refills | Status: DC

## 2022-09-01 NOTE — Assessment & Plan Note (Addendum)
Louer at night but not affecting her sleep ,  occurring Without vertigo.  Declines ENT  exam and MRI  until she has met deductible

## 2022-09-01 NOTE — Assessment & Plan Note (Signed)
Symptoms are controlled with daily use of lexapro and prn use of 0.25 mg alprazolam( 1/2 tablet) .  Refill history was reviewed via [facility] database.  The risks and benefits of benzodiazepine use were reviewed  with patient today including excessive sedation leading to respiratory depression,  impaired thinking/driving, and addiction.  Patient was advised to avoid concurrent use with alcohol, to use medication only as needed and not to share with others  .

## 2022-09-01 NOTE — Assessment & Plan Note (Signed)
Minimal,  likely due to hctz.  No changes today  Lab Results  Component Value Date   NA 134 (L) 09/01/2022   K 4.3 09/01/2022   CL 96 09/01/2022   CO2 29 09/01/2022

## 2022-09-01 NOTE — Assessment & Plan Note (Signed)
Aggravated by frequent self catheterization. Trial of estrace cream.  UA and culture to rule out UTI

## 2022-09-01 NOTE — Assessment & Plan Note (Addendum)
Home readings reviewed  all are < 130/80. On telmisartam/hct and minimal dose of metoprolol,  started for prevention of SVT .  No changes to regimen   Lab Results  Component Value Date   CREATININE 0.69 09/01/2022   Lab Results  Component Value Date   NA 134 (L) 09/01/2022   K 4.3 09/01/2022   CL 96 09/01/2022   CO2 29 09/01/2022

## 2022-09-01 NOTE — Progress Notes (Signed)
Patient ID: Natalie Pugh, female    DOB: [birth year]  Age: 57 y.o. MRN: [id number]  The patient is here for annual preventive examination and management of other chronic and acute problems.   The risk factors are reflected in the social history.   The roster of all physicians providing medical care to patient - is listed in the Snapshot section of the chart.   Activities of daily living:  The patient is 100% independent in all ADLs: dressing, toileting, feeding as well as independent mobility   Home safety : The patient has smoke detectors in the home. They wear seatbelts.  There are no unsecured firearms at home. There is no violence in the home.    There is no risks for hepatitis, STDs or HIV. There is no   history of blood transfusion. They have no travel history to infectious disease endemic areas of the world.   The patient has seen their dentist in the last six month. They have seen their eye doctor in the last year. The patinet  denies slight hearing difficulty with regard to whispered voices and some television programs.  They have deferred audiologic testing in the last year.  They do not  have excessive sun exposure. Discussed the need for sun protection: hats, long sleeves and use of sunscreen if there is significant sun exposure.    Diet: the importance of a healthy diet is discussed. They do have a healthy diet.   The benefits of regular aerobic exercise were discussed. The patient  does not exercise but walks inside her home.     Depression screen: there are no signs or vegative symptoms of depression- irritability, change in appetite, anhedonia, sadness/tearfullness.   The following portions of the patient's history were reviewed and updated as appropriate: allergies, current medications, past family history, past medical history,  past surgical history, past social history  and problem list.   Visual acuity was not assessed per patient preference since the patient has  regular follow up with an  ophthalmologist. Hearing and body mass index were assessed and reviewed.    During the course of the visit the patient was educated and counseled about appropriate screening and preventive services including : fall prevention , diabetes screening, nutrition counseling, colorectal cancer screening, and recommended immunizations.    Chief Complaint:   subacute development of urethral discomfort with self catheterization .  Does this 6 -8 times daily .   Hypertension: patient checks blood pressure daily at home.  Readings have been < 130/80 at rest . Pa tient is following a reduced salt diet most days and is taking medications as prescribed    Review of Symptoms  Patient denies headache, fevers, malaise, unintentional weight loss, skin rash, eye pain, sinus congestion and sinus pain, sore throat, dysphagia,  hemoptysis , cough, dyspnea, wheezing, chest pain, palpitations, orthopnea, edema, abdominal pain, nausea, melena, diarrhea, constipation, flank pain, dysuria, hematuria, , numbness, tingling, seizures,  Focal weakness, Loss of consciousness,  Tremor, insomnia, depression, anxiety, and suicidal ideation.    Physical Exam:  BP 110/81   Pulse 67   Temp (!) 97.4 F (36.3 C) (Oral)   Ht 5\' 5"  (1.651 m)   Wt 203 lb (92.1 kg)   LMP 09/28/2014 (Exact Date)   SpO2 97%   BMI 33.78 kg/m    Physical Exam Vitals reviewed.  Constitutional:      General: She is not in acute distress.    Appearance: Normal appearance. She is well-developed and  normal weight. She is not ill-appearing, toxic-appearing or diaphoretic.  HENT:     Head: Normocephalic.     Right Ear: Tympanic membrane, ear canal and external ear normal. There is no impacted cerumen.     Left Ear: Tympanic membrane, ear canal and external ear normal. There is no impacted cerumen.     Nose: Nose normal.     Mouth/Throat:     Mouth: Mucous membranes are moist.     Pharynx: Oropharynx is clear.  Eyes:      General: No scleral icterus.       Right eye: No discharge.        Left eye: No discharge.     Conjunctiva/sclera: Conjunctivae normal.     Pupils: Pupils are equal, round, and reactive to light.  Neck:     Thyroid: No thyromegaly.     Vascular: No carotid bruit or JVD.  Cardiovascular:     Rate and Rhythm: Normal rate and regular rhythm.     Heart sounds: Normal heart sounds.  Pulmonary:     Effort: Pulmonary effort is normal. No respiratory distress.     Breath sounds: Normal breath sounds.  Chest:  Breasts:    Breasts are symmetrical.     Right: Normal. No swelling, inverted nipple, mass, nipple discharge, skin change or tenderness.     Left: Normal. No swelling, inverted nipple, mass, nipple discharge, skin change or tenderness.  Abdominal:     General: Bowel sounds are normal.     Palpations: Abdomen is soft. There is no mass.     Tenderness: There is no abdominal tenderness. There is no guarding or rebound.  Musculoskeletal:        General: Normal range of motion.     Cervical back: Normal range of motion and neck supple.  Lymphadenopathy:     Cervical: No cervical adenopathy.     Upper Body:     Right upper body: No supraclavicular, axillary or pectoral adenopathy.     Left upper body: No supraclavicular, axillary or pectoral adenopathy.  Skin:    General: Skin is warm and dry.  Neurological:     General: No focal deficit present.     Mental Status: She is alert and oriented to person, place, and time. Mental status is at baseline.  Psychiatric:        Mood and Affect: Mood normal.        Behavior: Behavior normal.        Thought Content: Thought content normal.        Judgment: Judgment normal.    Assessment and Plan: Encounter for screening mammogram for malignant neoplasm of breast -     3D Screening Mammogram, Left and Right; Future  Encounter for preventive health examination Assessment & Plan: age appropriate education and counseling updated, referrals  for preventative services and immunizations addressed, dietary and smoking counseling addressed, most recent labs reviewed.  I have personally reviewed and have noted:   1) the patient's medical and social history 2) The pt's use of alcohol, tobacco, and illicit drugs 3) The patient's current medications and supplements 4) Functional ability including ADL's, fall risk, home safety risk, hearing and visual impairment 5) Diet and physical activities 6) Evidence for depression or mood disorder 7) The patient's height, weight, and BMI have been recorded in the chart   I have made referrals, and provided counseling and education based on review of the above    Prediabetes -  Comprehensive metabolic panel -     Hemoglobin A1c -     Microalbumin / creatinine urine ratio  Fatigue, unspecified type -     TSH -     CBC with Differential/Platelet  Essential hypertension -     Comprehensive metabolic panel -     Microalbumin / creatinine urine ratio  Hyperlipidemia, unspecified hyperlipidemia type -     Lipid panel -     LDL cholesterol, direct  Neurogenic bladder Assessment & Plan: Requiring self catheterization 5 to 8 times daily .  DME for lubricants and straight tip  catheter  requested,   qty  increased to 200 /month due to frequent nocturia   A4332 and A4351    Orders: -     Urinalysis, Routine w reflex microscopic; Future -     Urine Culture; Future  Multiple sclerosis, relapsing-remitting (HCC) Assessment & Plan: Currently untreated by choice.  Feels better without  Rebiff.  Has not had Follow up with Dr Sherryll Burger in over a year.  However she continues regular checkups with ophthalomology.   Last MRI 2018 and she deferred repeat imaging  due to cost  and lack of progression of symptoms .  She is walking daily in the house,  using Fit Bit to guide her., and doing the "under desk" bike.  Cleaning her own house and independent.    Her balance is poor so she is now using a cane to  prevent falls.     Nocturia Assessment & Plan: Occurring 3 times per night   Orders: -     Urinalysis, Routine w reflex microscopic -     Urine Culture  Tinnitus aurium, bilateral Assessment & Plan: Louer at night but not affecting her sleep ,  occurring Without vertigo.  Declines ENT  exam and MRI  until she has met deductible    Venous insufficiency of both lower extremities Assessment & Plan: Secondary to disuse from MS.  Encouarged to elevate feet and exercise calves more frequently    Primary insomnia Assessment & Plan: Managed with melatonin and 1/2 alprazolam prn . Refill needed   Orders: -     ALPRAZolam; Take 2 tablets (1 mg total) by mouth 2 (two) times daily as needed for anxiety or sleep.  Dispense: 60 tablet; Refill: 5  Elevated blood pressure reading in office with white coat syndrome, with diagnosis of hypertension Assessment & Plan: Home readings reviewed  all are < 130/80. On telmisartam/hct and minimal dose of metoprolol,  started for prevention of SVT .  No changes to regimen   Lab Results  Component Value Date   CREATININE 0.69 09/01/2022   Lab Results  Component Value Date   NA 134 (L) 09/01/2022   K 4.3 09/01/2022   CL 96 09/01/2022   CO2 29 09/01/2022      Hyponatremia Assessment & Plan: Minimal,  likely due to hctz.  No changes today  Lab Results  Component Value Date   NA 134 (L) 09/01/2022   K 4.3 09/01/2022   CL 96 09/01/2022   CO2 29 09/01/2022      Generalized anxiety disorder Assessment & Plan: Symptoms are controlled with daily use of lexapro and prn use of 0.25 mg alprazolam( 1/2 tablet) .  Refill history was reviewed via [place] [facility] database.  The risks and benefits of benzodiazepine use were reviewed  with patient today including excessive sedation leading to respiratory depression,  impaired thinking/driving, and addiction.  Patient was advised to avoid  concurrent use with alcohol, to use medication only as needed and not to  share with others  .    Obesity (BMI 30-39.9) Assessment & Plan: She has lost 7 lbs since last year.  I have addressed  BMI and recommended a low glycemic index diet utilizing smaller more frequent meals to increase metabolism.  I have also recommended that patient increase her exercise with a goal of 30 minutes of aerobic exercise a minimum of 5 days per week. Screening for lipid disorders, thyroid and diabetes to be done today.  Lab Results  Component Value Date   TSH 0.96 09/01/2022   Lab Results  Component Value Date   HGBA1C 5.8 09/01/2022   Lab Results  Component Value Date   CHOL 218 (H) 09/01/2022   HDL 63.90 09/01/2022   LDLCALC 129 (H) 09/01/2022   LDLDIRECT 131.0 09/01/2022   TRIG 124.0 09/01/2022   CHOLHDL 3 09/01/2022        Urethral pain Assessment & Plan: Aggravated by frequent self catheterization. Trial of estrace cream.  UA and culture to rule out UTI   Other orders -     Metoprolol Succinate ER; Take 1 tablet (50 mg total) by mouth daily. Take with or immediately following a meal.  Dispense: 90 tablet; Refill: 1 -     Telmisartan-HCTZ; Take 1 tablet by mouth daily.  Dispense: 90 tablet; Refill: 1 -     Estradiol; Place 1 Applicatorful vaginally at bedtime. FOR 14 DAYS, then twice weekly thereafter  Dispense: 42.5 g; Refill: 12    Return in about 6 months (around 03/03/2023).  Sherlene Shams, MD

## 2022-09-01 NOTE — Assessment & Plan Note (Addendum)
Secondary to disuse from MS.  Encouarged to elevate feet and exercise calves more frequently

## 2022-09-01 NOTE — Assessment & Plan Note (Addendum)
Currently untreated by choice.  Feels better without  Rebiff.  Has not had Follow up with Dr Sherryll Burger in over a year.  However she continues regular checkups with ophthalomology.   Last MRI 2018 and she deferred repeat imaging  due to cost  and lack of progression of symptoms .  She is walking daily in the house,  using Fit Bit to guide her., and doing the "under desk" bike.  Cleaning her own house and independent.    Her balance is poor so she is now using a cane to prevent falls.

## 2022-09-01 NOTE — Assessment & Plan Note (Signed)

## 2022-09-01 NOTE — Patient Instructions (Addendum)
Natalie Pugh, PT,   is the therapist I mentioned today.  If this number is not working let me know since he is now working out of his home .  Ph [phone]   Fax [phone]  [phone]     Vaginal estrogen to treat atrophic changes that may be affecting the urethra :   apply nightly with finger tip for 2 weeks,  then reduce use to  2/week   Alprazolam has been refilled

## 2022-09-01 NOTE — Assessment & Plan Note (Signed)
She has lost 7 lbs since last year.  I have addressed  BMI and recommended a low glycemic index diet utilizing smaller more frequent meals to increase metabolism.  I have also recommended that patient increase her exercise with a goal of 30 minutes of aerobic exercise a minimum of 5 days per week. Screening for lipid disorders, thyroid and diabetes to be done today.  Lab Results  Component Value Date   TSH 0.96 09/01/2022   Lab Results  Component Value Date   HGBA1C 5.8 09/01/2022   Lab Results  Component Value Date   CHOL 218 (H) 09/01/2022   HDL 63.90 09/01/2022   LDLCALC 129 (H) 09/01/2022   LDLDIRECT 131.0 09/01/2022   TRIG 124.0 09/01/2022   CHOLHDL 3 09/01/2022

## 2022-09-01 NOTE — Assessment & Plan Note (Signed)
Occurring 3 times per night

## 2022-09-01 NOTE — Assessment & Plan Note (Signed)
Managed with melatonin and 1/2 alprazolam prn . Refill needed

## 2022-09-01 NOTE — Assessment & Plan Note (Addendum)
Requiring self catheterization 5 to 8 times daily .  DME for lubricants and straight tip  catheter  requested,   qty  increased to 200 /month due to frequent nocturia   A4332 and A4351

## 2022-09-03 LAB — URINE CULTURE
MICRO NUMBER:: 14868045
SPECIMEN QUALITY:: ADEQUATE

## 2022-09-03 MED ORDER — CIPROFLOXACIN HCL 250 MG PO TABS: 250.0000 mg | ORAL_TABLET | Freq: Two times a day (BID) | ORAL | 0 refills | Status: AC

## 2022-09-03 NOTE — Addendum Note (Signed)
Addended by: Sherlene Shams on: 09/03/2022 01:35 PM   Modules accepted: Orders

## 2023-01-14 ENCOUNTER — Other Ambulatory Visit: Payer: Self-pay | Admitting: Neurology

## 2023-01-14 DIAGNOSIS — G35 Multiple sclerosis: Secondary | ICD-10-CM

## 2023-01-26 ENCOUNTER — Ambulatory Visit: Payer: 59

## 2023-01-29 ENCOUNTER — Other Ambulatory Visit: Payer: 59

## 2023-02-02 ENCOUNTER — Ambulatory Visit: Payer: 59 | Admitting: Occupational Therapy

## 2023-02-02 ENCOUNTER — Ambulatory Visit: Payer: 59

## 2023-02-04 ENCOUNTER — Other Ambulatory Visit: Payer: 59

## 2023-02-09 ENCOUNTER — Other Ambulatory Visit: Payer: 59

## 2023-02-22 ENCOUNTER — Other Ambulatory Visit: Payer: Self-pay | Admitting: Internal Medicine

## 2023-02-23 ENCOUNTER — Ambulatory Visit
Admission: RE | Admit: 2023-02-23 | Discharge: 2023-02-23 | Disposition: A | Discharge status: Home / Self Care | Payer: 59 | Source: Ambulatory Visit | Attending: Neurology | Admitting: Neurology

## 2023-02-23 DIAGNOSIS — G35 Multiple sclerosis: Secondary | ICD-10-CM

## 2023-02-23 MED ORDER — GADOPICLENOL 0.5 MMOL/ML IV SOLN
10.0000 mL | Freq: Once | INTRAVENOUS | Status: AC | PRN
  Administered 2023-02-23: 10 mL via INTRAVENOUS

## 2023-03-03 ENCOUNTER — Ambulatory Visit: Payer: 59 | Admitting: Internal Medicine

## 2023-03-16 ENCOUNTER — Ambulatory Visit: Payer: 59 | Attending: Internal Medicine

## 2023-03-16 DIAGNOSIS — R262 Difficulty in walking, not elsewhere classified: Secondary | ICD-10-CM

## 2023-03-16 DIAGNOSIS — R2681 Unsteadiness on feet: Secondary | ICD-10-CM | POA: Diagnosis present

## 2023-03-16 DIAGNOSIS — R278 Other lack of coordination: Secondary | ICD-10-CM

## 2023-03-16 DIAGNOSIS — M6281 Muscle weakness (generalized): Secondary | ICD-10-CM

## 2023-03-16 NOTE — Therapy (Signed)
OUTPATIENT PHYSICAL THERAPY NEURO EVALUATION   Patient Name: Natalie Pugh MRN: [id number] DOB:August 24, 1965, 57 y.o., female Today's Date: 03/16/2023   PCP:    Sherlene Shams, MD   REFERRING PROVIDER:    Sherlene Shams, MD    END OF SESSION:  PT End of Session - 03/16/23 1244     Visit Number 1    Number of Visits 25    Date for PT Re-Evaluation 06/08/23    PT Start Time 1200    PT Stop Time 1242    PT Time Calculation (min) 42 min             Past Medical History:  Diagnosis Date   Anxiety    Diabetes mellitus    Hypertension    Multiple sclerosis, relapsing-remitting (HCC) 2001   managed since 2001,  present since 1994,  Dr. Gardiner Ramus Neurolgoy   Neurogenic bladder    SVT (supraventricular tachycardia) [facility])    Past Surgical History:  Procedure Laterality Date   COLONOSCOPY WITH PROPOFOL N/A 01/29/2015   Procedure: COLONOSCOPY WITH PROPOFOL;  Surgeon: Earline Mayotte, MD;  Location: [facility] ENDOSCOPY;  Service: Endoscopy;  Laterality: N/A;   LUNG REMOVAL, PARTIAL Left 2016   Patient Active Problem List   Diagnosis Date Noted   Neurogenic bladder 09/01/2022   Tinnitus aurium, bilateral 09/01/2022   Urethral pain 09/01/2022   Nocturia 01/19/2021   Hyponatremia 07/27/2020   Primary insomnia 06/21/2020   Fatigue 03/11/2019   Right anterior shoulder pain 12/29/2017   Venous insufficiency of both lower extremities 07/17/2017   Foraminal stenosis of cervical region 04/10/2017   Constipation 04/10/2017   Encounter for screening colonoscopy 12/03/2014   Encounter for preventive health examination 11/24/2014   Prediabetes    Generalized anxiety disorder    Elevated blood pressure reading in office with white coat syndrome, with diagnosis of hypertension 09/23/2011   Obesity (BMI 30-39.9) 09/23/2011   Multiple sclerosis, relapsing-remitting (HCC)     ONSET DATE: 2001  REFERRING DIAG: G35 (ICD-10-CM) - Multiple sclerosis   THERAPY DIAG:   Unsteadiness on feet  Difficulty in walking, not elsewhere classified  Muscle weakness (generalized)  Other lack of coordination  Rationale for Evaluation and Treatment: Rehabilitation  SUBJECTIVE:                                                                                                                                                                                             SUBJECTIVE STATEMENT: Pt is a pleasant 57 yo female referred for impairments due to relapsing, remitting MS. Pt diagnosed in 2001. She reports decreased strength, balance and  mobility, left side is most affected. Pt uses SPC, but does have RW for long distances, and trekking poles. She is concerned about staggering gait and at times must grab onto furniture to steady herself. She reports no dizziness. She has not fallen in the past 6 months, but has had maybe two falls in the past year. She says falls are due to L foot drag. Pt does not have pain currently but can have BLE spasms/cramps. She has neurogenic bladder, self-caths.  Pt doing exercises given by neuro 5x/week for LE strengthening. She thinks she can walk up to 10 maybe 15 min with her walker. Pt is concerned about LUE function, but would like to focus on PT first before pursuing OT. Pt accompanied by: significant other and family member  PERTINENT HISTORY:  Pt is a pleasant 57 yo female referred for impairments due to relapsing, remitting MS. Pt diagnosed in 2001. She reports decreased strength, balance and mobility, left side is most affected. Pt uses SPC, but does have RW for long distances, and trekking poles. She is concerned about staggering gait and at times must grab onto furniture to steady herself. She reports no dizziness. She has not fallen in the past 6 months, but has had maybe two falls in the past year. She says falls are due to L foot drag. Pt does not have pain currently but can have BLE spasms/cramps. She has neurogenic bladder, self-caths.   Pt doing exercises given by neuro 5x/week for LE strengthening. She thinks she can walk up to 10 maybe 15 min with her walker. Pt is concerned about LUE function, but would like to focus on PT first before pursuing OT. PMH significant for DM, anxiety, HTN, neurogenic bladder, SVT, hx partial left lung removal 2016. See chart for full details  PAIN:  Are you having pain? No  PRECAUTIONS: None  RED FLAGS: Bowel or bladder incontinence: Yes: neurogenic bladder    WEIGHT BEARING RESTRICTIONS: No  FALLS: Has patient fallen in last 6 months? No  LIVING ENVIRONMENT: Lives with: lives with their family, spouse Lives in: House/apartment - one story house Stairs:  one step to enter  Has following equipment at home: Single point cane, Environmental consultant - 4 wheeled, and walking poles   PLOF: Independent  PATIENT GOALS: Improve my walking, strength and mobility  OBJECTIVE:  Note: Objective measures were completed at Evaluation unless otherwise noted.  DIAGNOSTIC FINDINGS:   02/23/2023 MR BRAIN and 02/23/2023 pending results?  MR BRAIN 01/04/2017: "IMPRESSION: 1. Minimal periventricular T2 signal changes in some involvement of the body of the corpus callosum could be related to a demyelinating process. These changes are nonspecific and can be seen in relation to chronic microvascular ischemia, prior infection, or inflammation. 2. No focal signal abnormality within the cervical spinal cord. 3. Mild right foraminal narrowing at C3-4 is worse on the right. 4. Moderate right foraminal stenosis at C4-5. 5. Severe right and moderate left foraminal stenosis at C5-6 and C6-7. 6. The right central canal narrowing at C5-6 and C6-7.     Electronically Signed   By: Marin Roberts M.D.   On: 01/04/2017 13:24"  COGNITION: Overall cognitive status: Within functional limits for tasks assessed   SENSATION: Pt reports n/t on and off in her hands and toes   COORDINATION:   WNL UE with rapid alt  movement and WNL LE heel>shin    POSTURE: rounded shoulders and increased thoracic kyphosis   LOWER EXTREMITY MMT:    Gross strength is  4+/5 RLE, 4/5 LLE    TRANSFERS: Assistive device utilized: None  Sit to stand: Complete Independence Stand to sit: Complete Independence Chair to chair: Complete Independence   STAIRS: Level of Assistance: Modified independence Stair Negotiation Technique: Step to Pattern Alternating Pattern  with Bilateral Rails Number of Stairs: 4    GAIT: Gait pattern:  Decreased gait speed, increased variability in BOS, impaired balance with head turns, decreased heel strike bilat, increased knee ext. with mid-stance bilat Distance walked: 10 meters/clinic distances Assistive device utilized: Single point cane and no AD with SBA-CGA   FUNCTIONAL TESTS:  5 times sit to stand: 13.5 sec  6 minute walk test: deferred 10 meter walk test: 0.77 m/s with SPC and 0.83 without SPC  Dynamic Gait Index: 16/24 TUG: 14.4 sec without SPC, 12.3 sec with SPC   PATIENT SURVEYS:  ABC scale deferred FOTO 55 - risk adjusted 46 (goal 56)  TODAY'S TREATMENT:                                                                                                                              DATE: 03/16/23  TA: Reviewed technique with standing hip ext, hip abd, and marches per pt request to confirm correct technique (pt reports these exercises were from printout provided by physician)     PATIENT EDUCATION: Education details: exam findings, goals, plan, exercise technique Person educated: Patient and Spouse Education method: Explanation, Demonstration, and Verbal cues Education comprehension: verbalized understanding, returned demonstration, verbal cues required, and needs further education  HOME EXERCISE PROGRAM: HEP, with focus on balance, to be initiated next visit   GOALS: Goals reviewed with patient? Yes   SHORT TERM GOALS: Target date:  04/27/2023    Patient will be independent in home exercise program to improve balance, strength/mobility for better functional independence with ADLs. Baseline: to be initiated  Goal status: INITIAL   LONG TERM GOALS: Target date: 06/08/2023   Patient will increase FOTO score to equal to or greater than  56   to demonstrate statistically significant improvement in mobility and quality of life.  Baseline: 46 risk-adjusted Goal status: INITIAL  2.  Patient (< 83 years old) will complete five times sit to stand test in < 10 seconds indicating an increased LE strength and improved balance. Baseline: 13.5 sec (avg of two trials) Goal status: INITIAL   3.  Patient will increase 10 meter walk test to >1.57m/s as to improve gait speed for better community ambulation and to reduce fall risk. Baseline: 0.77 m/s with SPC, 0.83 m/s without SPC Goal status: INITIAL  4.  Patient will reduce timed up and go to <11 seconds to reduce fall risk and demonstrate improved transfer/gait ability. Baseline:  14.4 sec with out SPC, 12.3 sec with SPC Goal status: INITIAL  5.  Patient will increase dynamic gait index score to >19/24 as to demonstrate reduced fall risk and improved dynamic gait balance for better safety with community/home ambulation.  Baseline: 16/24 Goal status: INITIAL   ASSESSMENT:  CLINICAL IMPRESSION: Patient is a pleasant 57 y.o. female who was seen today for physical therapy evaluation and treatment for impairments due to relapsing remitting MS. Exam findings indicate decreased BLE strength, balance and impaired gait mechanics and speed AEB MMT, 5xSTS, DGI and performances. TUG, DGI and 5xSTS tests also indicate pt at increased risk for future falls. The pt will benefit from further skilled PT to address these impairments in order to increase strength, balance, gait, mobility and QOL and to decrease fall risk.    OBJECTIVE IMPAIRMENTS: Abnormal gait, decreased activity  tolerance, decreased balance, decreased coordination, decreased mobility, difficulty walking, decreased strength, increased muscle spasms, impaired UE functional use, improper body mechanics, postural dysfunction, and pain.   ACTIVITY LIMITATIONS: lifting, standing, squatting, transfers, continence, and locomotion level  PARTICIPATION LIMITATIONS: shopping, community activity, and yard work  PERSONAL FACTORS: Age, Sex, Time since onset of injury/illness/exacerbation, and 3+ comorbidities: PMH significant for DM, anxiety, HTN, neurogenic bladder, SVT, hx partial left lung removal 2016.   are also affecting patient's functional outcome.   REHAB POTENTIAL: Good  CLINICAL DECISION MAKING: Evolving/moderate complexity  EVALUATION COMPLEXITY: Moderate  PLAN:  PT FREQUENCY: 1-2x/week  PT DURATION: 12 weeks  PLANNED INTERVENTIONS: 97164- PT Re-evaluation, 97110-Therapeutic exercises, 97530- Therapeutic activity, 97112- Neuromuscular re-education, 97535- Self Care, 13086- Manual therapy, [phone]- Gait training, [phone]- Orthotic Fit/training, [phone]- Canalith repositioning, [phone]- Splinting, Patient/Family education, Balance training, Stair training, Taping, Joint mobilization, Spinal mobilization, Vestibular training, DME instructions, Cryotherapy, and Moist heat  PLAN FOR NEXT SESSION: develop balance HEP next visit, , SLB   Baird Kay, PT 03/16/2023, 1:43 PM

## 2023-03-23 ENCOUNTER — Ambulatory Visit: Payer: 59

## 2023-03-23 DIAGNOSIS — M6281 Muscle weakness (generalized): Secondary | ICD-10-CM

## 2023-03-23 DIAGNOSIS — R262 Difficulty in walking, not elsewhere classified: Secondary | ICD-10-CM

## 2023-03-23 DIAGNOSIS — R2681 Unsteadiness on feet: Secondary | ICD-10-CM

## 2023-03-23 NOTE — Therapy (Signed)
OUTPATIENT PHYSICAL THERAPY NEURO TREATMENT NOTE   Patient Name: Natalie Pugh MRN: [id number] DOB:[birth year], 57 y.o., female Today's Date: 03/23/2023   PCP:    Sherlene Shams, MD   REFERRING PROVIDER:    Sherlene Shams, MD    END OF SESSION:  PT End of Session - 03/23/23 1707     Visit Number 2    Number of Visits 25    Date for PT Re-Evaluation 06/08/23    PT Start Time 1616    PT Stop Time 1700    PT Time Calculation (min) 44 min    Equipment Utilized During Treatment Gait belt    Activity Tolerance Patient tolerated treatment well    Behavior During Therapy WFL for tasks assessed/performed              Past Medical History:  Diagnosis Date   Anxiety    Diabetes mellitus    Hypertension    Multiple sclerosis, relapsing-remitting (HCC) 2001   managed since 2001,  present since 1994,  Dr. Gardiner Ramus Neurolgoy   Neurogenic bladder    SVT (supraventricular tachycardia) [facility])    Past Surgical History:  Procedure Laterality Date   COLONOSCOPY WITH PROPOFOL N/A 01/29/2015   Procedure: COLONOSCOPY WITH PROPOFOL;  Surgeon: Earline Mayotte, MD;  Location: [facility] ENDOSCOPY;  Service: Endoscopy;  Laterality: N/A;   LUNG REMOVAL, PARTIAL Left 2016   Patient Active Problem List   Diagnosis Date Noted   Neurogenic bladder 09/01/2022   Tinnitus aurium, bilateral 09/01/2022   Urethral pain 09/01/2022   Nocturia 01/19/2021   Hyponatremia 07/27/2020   Primary insomnia 06/21/2020   Fatigue 03/11/2019   Right anterior shoulder pain 12/29/2017   Venous insufficiency of both lower extremities 07/17/2017   Foraminal stenosis of cervical region 04/10/2017   Constipation 04/10/2017   Encounter for screening colonoscopy 12/03/2014   Encounter for preventive health examination 11/24/2014   Prediabetes    Generalized anxiety disorder    Elevated blood pressure reading in office with white coat syndrome, with diagnosis of hypertension 09/23/2011   Obesity (BMI  30-39.9) 09/23/2011   Multiple sclerosis, relapsing-remitting (HCC)     ONSET DATE: 2001  REFERRING DIAG: G35 (ICD-10-CM) - Multiple sclerosis   THERAPY DIAG:  Unsteadiness on feet  Muscle weakness (generalized)  Difficulty in walking, not elsewhere classified  Rationale for Evaluation and Treatment: Rehabilitation  SUBJECTIVE:                                                                                                                                                                                             SUBJECTIVE STATEMENT:  Pt doing well. No updates. Pt accompanied by: self  PERTINENT HISTORY:  Pt is a pleasant 57 yo female referred for impairments due to relapsing, remitting MS. Pt diagnosed in 2001. She reports decreased strength, balance and mobility, left side is most affected. Pt uses SPC, but does have RW for long distances, and trekking poles. She is concerned about staggering gait and at times must grab onto furniture to steady herself. She reports no dizziness. She has not fallen in the past 6 months, but has had maybe two falls in the past year. She says falls are due to L foot drag. Pt does not have pain currently but can have BLE spasms/cramps. She has neurogenic bladder, self-caths.  Pt doing exercises given by neuro 5x/week for LE strengthening. She thinks she can walk up to 10 maybe 15 min with her walker. Pt is concerned about LUE function, but would like to focus on PT first before pursuing OT. PMH significant for DM, anxiety, HTN, neurogenic bladder, SVT, hx partial left lung removal 2016. See chart for full details  PAIN:  Are you having pain? No  PRECAUTIONS: None  RED FLAGS: Bowel or bladder incontinence: Yes: neurogenic bladder    WEIGHT BEARING RESTRICTIONS: No  FALLS: Has patient fallen in last 6 months? No  LIVING ENVIRONMENT: Lives with: lives with their family, spouse Lives in: House/apartment - one story house Stairs:  one step to enter   Has following equipment at home: Single point cane, Environmental consultant - 4 wheeled, and walking poles   PLOF: Independent  PATIENT GOALS: Improve my walking, strength and mobility  OBJECTIVE:  Note: Objective measures were completed at Evaluation unless otherwise noted.  DIAGNOSTIC FINDINGS:   02/23/2023 MR BRAIN and 02/23/2023 pending results?  MR BRAIN 01/04/2017: "IMPRESSION: 1. Minimal periventricular T2 signal changes in some involvement of the body of the corpus callosum could be related to a demyelinating process. These changes are nonspecific and can be seen in relation to chronic microvascular ischemia, prior infection, or inflammation. 2. No focal signal abnormality within the cervical spinal cord. 3. Mild right foraminal narrowing at C3-4 is worse on the right. 4. Moderate right foraminal stenosis at C4-5. 5. Severe right and moderate left foraminal stenosis at C5-6 and C6-7. 6. The right central canal narrowing at C5-6 and C6-7.     Electronically Signed   By: Marin Roberts M.D.   On: 01/04/2017 13:24"  COGNITION: Overall cognitive status: Within functional limits for tasks assessed   SENSATION: Pt reports n/t on and off in her hands and toes   COORDINATION:   WNL UE with rapid alt movement and WNL LE heel>shin    POSTURE: rounded shoulders and increased thoracic kyphosis   LOWER EXTREMITY MMT:    Gross strength is 4+/5 RLE, 4/5 LLE    TRANSFERS: Assistive device utilized: None  Sit to stand: Complete Independence Stand to sit: Complete Independence Chair to chair: Complete Independence   STAIRS: Level of Assistance: Modified independence Stair Negotiation Technique: Step to Pattern Alternating Pattern  with Bilateral Rails Number of Stairs: 4    GAIT: Gait pattern:  Decreased gait speed, increased variability in BOS, impaired balance with head turns, decreased heel strike bilat, increased knee ext. with mid-stance bilat Distance walked: 10  meters/clinic distances Assistive device utilized: Single point cane and no AD with SBA-CGA   FUNCTIONAL TESTS:  5 times sit to stand: 13.5 sec  6 minute walk test: deferred 10 meter walk test: 0.77 m/s with SPC and  0.83 without SPC  Dynamic Gait Index: 16/24 TUG: 14.4 sec without SPC, 12.3 sec with SPC   PATIENT SURVEYS:  ABC scale deferred FOTO 55 - risk adjusted 46 (goal 56)  TODAY'S TREATMENT:                                                                                                                              DATE: 03/23/23  TA: completion of further assessment and review instruction of HEP for home progress   : 1100 ft  with SPC   Review of technique throughout   At support surface: SLB 4x30 sec each LE Tandem stance 2x30 sec each LE Comments: intermittent UE support   Standing in corner, chair in front, NBOS EC 3x30 sec  STS 2x15 hands-free Seated march 2x15 - challenging  The above interventions, exception of were added to HEP, PT educated pt throughout in proper technique   Reviewed HEP printout:  Access Code: [email or web address] URL: [email or web address] Date: 03/23/2023 Prepared by: Temple Pacini  Exercises - Standing Tandem Balance with Counter Support  - 2 x daily - 7 x weekly - 2 sets - 1 reps - 30 sec/leg hold - Standing Single Leg Stance with Counter Support  - 2 x daily - 7 x weekly - 2 sets - 1 reps - 30 sec/leg  hold - Standing Near Stance in Corner with Eyes Closed  - 2 x daily - 7 x weekly - 2 sets - 1 reps - 30 seconds hold  **written instruction to complete with sturdy chair in front as well - Sit to Stand with Counter Support  - 1 x daily - 4-5 x weekly - 2 sets - 10-15 reps - Seated March  - 1 x daily - 4-5 x weekly - 2 sets - 10-15 reps      PATIENT EDUCATION: Education details: exercise technique, HEP Person educated: Patient and Spouse Education method: Explanation, Demonstration, and Verbal cues Education  comprehension: verbalized understanding, returned demonstration, verbal cues required, and needs further education  HOME EXERCISE PROGRAM: Access Code: [email or web address] URL: [email or web address] Date: 03/23/2023 Prepared by: Temple Pacini  Exercises - Standing Tandem Balance with Counter Support  - 2 x daily - 7 x weekly - 2 sets - 1 reps - 30 sec/leg hold - Standing Single Leg Stance with Counter Support  - 2 x daily - 7 x weekly - 2 sets - 1 reps - 30 sec/leg  hold - Standing Near Stance in Corner with Eyes Closed  - 2 x daily - 7 x weekly - 2 sets - 1 reps - 30 seconds hold - Sit to Stand with Counter Support  - 1 x daily - 4-5 x weekly - 2 sets - 10-15 reps - Seated March  - 1 x daily - 4-5 x weekly - 2 sets - 10-15 reps  GOALS: Goals reviewed with patient? Yes   SHORT TERM GOALS: Target date: 04/27/2023    Patient will  be independent in home exercise program to improve balance, strength/mobility for better functional independence with ADLs. Baseline: initiated  Goal status: INITIAL   LONG TERM GOALS: Target date: 06/08/2023   Patient will increase FOTO score to equal to or greater than  56   to demonstrate statistically significant improvement in mobility and quality of life.  Baseline: 46 risk-adjusted Goal status: INITIAL  2.  Patient (< 68 years old) will complete five times sit to stand test in < 10 seconds indicating an increased LE strength and improved balance. Baseline: 13.5 sec (avg of two trials) Goal status: INITIAL   3.  Patient will increase 10 meter walk test to >1.65m/s as to improve gait speed for better community ambulation and to reduce fall risk. Baseline: 0.77 m/s with SPC, 0.83 m/s without SPC Goal status: INITIAL  4.  Patient will reduce timed up and go to <11 seconds to reduce fall risk and demonstrate improved transfer/gait ability. Baseline:  14.4 sec with out SPC, 12.3 sec with SPC Goal status: INITIAL  5.  Patient will increase  dynamic gait index score to >19/24 as to demonstrate reduced fall risk and improved dynamic gait balance for better safety with community/home ambulation.   Baseline: 16/24 Goal status: INITIAL   ASSESSMENT:  CLINICAL IMPRESSION: completed and pt ambulated 1100 ft in 6 min, no observed or reported fatigue. Remainder of session dedicated to developing HEP with focus on balance. PT did add some LE strengthening to HEP, and encouraged pt to perform seated march if she is finding standing march intervention too difficult. The pt will benefit from further skilled PT to increase strength, balance, gait, mobility and QOL and to decrease fall risk.    OBJECTIVE IMPAIRMENTS: Abnormal gait, decreased activity tolerance, decreased balance, decreased coordination, decreased mobility, difficulty walking, decreased strength, increased muscle spasms, impaired UE functional use, improper body mechanics, postural dysfunction, and pain.   ACTIVITY LIMITATIONS: lifting, standing, squatting, transfers, continence, and locomotion level  PARTICIPATION LIMITATIONS: shopping, community activity, and yard work  PERSONAL FACTORS: Age, Sex, Time since onset of injury/illness/exacerbation, and 3+ comorbidities: PMH significant for DM, anxiety, HTN, neurogenic bladder, SVT, hx partial left lung removal 2016.   are also affecting patient's functional outcome.   REHAB POTENTIAL: Good  CLINICAL DECISION MAKING: Evolving/moderate complexity  EVALUATION COMPLEXITY: Moderate  PLAN:  PT FREQUENCY: 1-2x/week  PT DURATION: 12 weeks  PLANNED INTERVENTIONS: 97164- PT Re-evaluation, 97110-Therapeutic exercises, 97530- Therapeutic activity, 97112- Neuromuscular re-education, 97535- Self Care, 16109- Manual therapy, [phone]- Gait training, [phone]- Orthotic Fit/training, [phone]- Canalith repositioning, [phone]- Splinting, Patient/Family education, Balance training, Stair training, Taping, Joint mobilization, Spinal mobilization,  Vestibular training, DME instructions, Cryotherapy, and Moist heat  PLAN FOR NEXT SESSION: strength, balance   Baird Kay, PT 03/23/2023, 5:11 PM

## 2023-03-30 ENCOUNTER — Ambulatory Visit: Payer: 59

## 2023-03-30 DIAGNOSIS — R2681 Unsteadiness on feet: Secondary | ICD-10-CM | POA: Diagnosis not present

## 2023-03-30 DIAGNOSIS — M6281 Muscle weakness (generalized): Secondary | ICD-10-CM

## 2023-03-30 DIAGNOSIS — R262 Difficulty in walking, not elsewhere classified: Secondary | ICD-10-CM

## 2023-03-30 DIAGNOSIS — R278 Other lack of coordination: Secondary | ICD-10-CM

## 2023-03-30 NOTE — Therapy (Signed)
OUTPATIENT PHYSICAL THERAPY NEURO TREATMENT NOTE   Patient Name: Natalie Pugh MRN: [id number] DOB:12/15/1965, 57 y.o., female Today's Date: 03/30/2023   PCP:    Sherlene Shams, MD   REFERRING PROVIDER:    Sherlene Shams, MD    END OF SESSION:  PT End of Session - 03/30/23 1058     Visit Number 3    Number of Visits 25    Date for PT Re-Evaluation 06/08/23    PT Start Time 1101    PT Stop Time 1145    PT Time Calculation (min) 44 min    Equipment Utilized During Treatment Gait belt    Activity Tolerance Patient tolerated treatment well    Behavior During Therapy WFL for tasks assessed/performed              Past Medical History:  Diagnosis Date   Anxiety    Diabetes mellitus    Hypertension    Multiple sclerosis, relapsing-remitting (HCC) 2001   managed since 2001,  present since 1994,  Dr. Gardiner Ramus Neurolgoy   Neurogenic bladder    SVT (supraventricular tachycardia) [facility])    Past Surgical History:  Procedure Laterality Date   COLONOSCOPY WITH PROPOFOL N/A 01/29/2015   Procedure: COLONOSCOPY WITH PROPOFOL;  Surgeon: Earline Mayotte, MD;  Location: [facility] ENDOSCOPY;  Service: Endoscopy;  Laterality: N/A;   LUNG REMOVAL, PARTIAL Left 2016   Patient Active Problem List   Diagnosis Date Noted   Neurogenic bladder 09/01/2022   Tinnitus aurium, bilateral 09/01/2022   Urethral pain 09/01/2022   Nocturia 01/19/2021   Hyponatremia 07/27/2020   Primary insomnia 06/21/2020   Fatigue 03/11/2019   Right anterior shoulder pain 12/29/2017   Venous insufficiency of both lower extremities 07/17/2017   Foraminal stenosis of cervical region 04/10/2017   Constipation 04/10/2017   Encounter for screening colonoscopy 12/03/2014   Encounter for preventive health examination 11/24/2014   Prediabetes    Generalized anxiety disorder    Elevated blood pressure reading in office with white coat syndrome, with diagnosis of hypertension 09/23/2011   Obesity (BMI  30-39.9) 09/23/2011   Multiple sclerosis, relapsing-remitting (HCC)     ONSET DATE: 2001  REFERRING DIAG: G35 (ICD-10-CM) - Multiple sclerosis   THERAPY DIAG:  Unsteadiness on feet  Difficulty in walking, not elsewhere classified  Other lack of coordination  Muscle weakness (generalized)  Rationale for Evaluation and Treatment: Rehabilitation  SUBJECTIVE:  SUBJECTIVE STATEMENT: Pt reports she is a bit tired today. She has been working on her HEP, having some difficulty with SLB. Pt reports she'll have to stop PT after the end of the year due to insurance. Pt accompanied by: self  PERTINENT HISTORY:  Pt is a pleasant 57 yo female referred for impairments due to relapsing, remitting MS. Pt diagnosed in 2001. She reports decreased strength, balance and mobility, left side is most affected. Pt uses SPC, but does have RW for long distances, and trekking poles. She is concerned about staggering gait and at times must grab onto furniture to steady herself. She reports no dizziness. She has not fallen in the past 6 months, but has had maybe two falls in the past year. She says falls are due to L foot drag. Pt does not have pain currently but can have BLE spasms/cramps. She has neurogenic bladder, self-caths.  Pt doing exercises given by neuro 5x/week for LE strengthening. She thinks she can walk up to 10 maybe 15 min with her walker. Pt is concerned about LUE function, but would like to focus on PT first before pursuing OT. PMH significant for DM, anxiety, HTN, neurogenic bladder, SVT, hx partial left lung removal 2016. See chart for full details  PAIN:  Are you having pain? No  PRECAUTIONS: None  RED FLAGS: Bowel or bladder incontinence: Yes: neurogenic bladder    WEIGHT BEARING RESTRICTIONS:  No  FALLS: Has patient fallen in last 6 months? No  LIVING ENVIRONMENT: Lives with: lives with their family, spouse Lives in: House/apartment - one story house Stairs:  one step to enter  Has following equipment at home: Single point cane, Environmental consultant - 4 wheeled, and walking poles   PLOF: Independent  PATIENT GOALS: Improve my walking, strength and mobility  OBJECTIVE:  Note: Objective measures were completed at Evaluation unless otherwise noted.  DIAGNOSTIC FINDINGS:   02/23/2023 MR BRAIN and 02/23/2023 pending results?  MR BRAIN 01/04/2017: "IMPRESSION: 1. Minimal periventricular T2 signal changes in some involvement of the body of the corpus callosum could be related to a demyelinating process. These changes are nonspecific and can be seen in relation to chronic microvascular ischemia, prior infection, or inflammation. 2. No focal signal abnormality within the cervical spinal cord. 3. Mild right foraminal narrowing at C3-4 is worse on the right. 4. Moderate right foraminal stenosis at C4-5. 5. Severe right and moderate left foraminal stenosis at C5-6 and C6-7. 6. The right central canal narrowing at C5-6 and C6-7.     Electronically Signed   By: Marin Roberts M.D.   On: 01/04/2017 13:24"  COGNITION: Overall cognitive status: Within functional limits for tasks assessed   SENSATION: Pt reports n/t on and off in her hands and toes   COORDINATION:   WNL UE with rapid alt movement and WNL LE heel>shin    POSTURE: rounded shoulders and increased thoracic kyphosis   LOWER EXTREMITY MMT:    Gross strength is 4+/5 RLE, 4/5 LLE    TRANSFERS: Assistive device utilized: None  Sit to stand: Complete Independence Stand to sit: Complete Independence Chair to chair: Complete Independence   STAIRS: Level of Assistance: Modified independence Stair Negotiation Technique: Step to Pattern Alternating Pattern  with Bilateral Rails Number of Stairs:  4    GAIT: Gait pattern:  Decreased gait speed, increased variability in BOS, impaired balance with head turns, decreased heel strike bilat, increased knee ext. with mid-stance bilat Distance walked: 10 meters/clinic distances Assistive device utilized: Single point  cane and no AD with SBA-CGA   FUNCTIONAL TESTS:  5 times sit to stand: 13.5 sec  6 minute walk test: deferred 10 meter walk test: 0.77 m/s with SPC and 0.83 without SPC  Dynamic Gait Index: 16/24 TUG: 14.4 sec without SPC, 12.3 sec with SPC   PATIENT SURVEYS:  ABC scale deferred FOTO 55 - risk adjusted 46 (goal 56)  TODAY'S TREATMENT:                                                                                                                              DATE: 03/30/23  Gait belt donned throughout for pt safety. CGA provided unless specified otherwise   At support surface- SLB 2x30 sec each LE Modified SLB to one foot on floor, one on 6" step for progression 2x30 sec each LE, then performed with dual cog task 30 sec each LE hands-free SLB progression - cone taps completed each LE with decreasing levels of UE support and more complex patterns x several minutes   Gait with horizontal head turns 4x50 ft cuing to turn head every 3-6 steps throughout. Mild instability. Pt rates medium   Gait with vertical head turns 4x50 ft cuing to nod head every 5+ steps- feels better performing vertical head turns   Gait in long hallway scanning for sticky notes R and L sides 2x80 ft  At support surface- NBOS EC 2x30 sec WBOS EC horizontal and vertical head turns 2x10 for each Standing on airex WBOS EC 2x30 sec Standing on airex NBOS EC 2x30 sec CGA-min a    PATIENT EDUCATION: Education details: exercise technique Person educated: Patient and Spouse Education method: Explanation, Demonstration, and Verbal cues Education comprehension: verbalized understanding, returned demonstration, verbal cues required, and needs  further education  HOME EXERCISE PROGRAM: Access Code: [email or web address] URL: [email or web address] Date: 03/23/2023 Prepared by: Temple Pacini  Exercises - Standing Tandem Balance with Counter Support  - 2 x daily - 7 x weekly - 2 sets - 1 reps - 30 sec/leg hold - Standing Single Leg Stance with Counter Support  - 2 x daily - 7 x weekly - 2 sets - 1 reps - 30 sec/leg  hold - Standing Near Stance in Corner with Eyes Closed  - 2 x daily - 7 x weekly - 2 sets - 1 reps - 30 seconds hold - Sit to Stand with Counter Support  - 1 x daily - 4-5 x weekly - 2 sets - 10-15 reps - Seated March  - 1 x daily - 4-5 x weekly - 2 sets - 10-15 reps  GOALS: Goals reviewed with patient? Yes   SHORT TERM GOALS: Target date: 04/27/2023    Patient will be independent in home exercise program to improve balance, strength/mobility for better functional independence with ADLs. Baseline: initiated  Goal status: INITIAL   LONG TERM GOALS: Target date: 06/08/2023   Patient will increase FOTO score to equal to or greater than  56   to demonstrate statistically significant improvement in mobility and quality of life.  Baseline: 46 risk-adjusted Goal status: INITIAL  2.  Patient (< 7 years old) will complete five times sit to stand test in < 10 seconds indicating an increased LE strength and improved balance. Baseline: 13.5 sec (avg of two trials) Goal status: INITIAL   3.  Patient will increase 10 meter walk test to >1.56m/s as to improve gait speed for better community ambulation and to reduce fall risk. Baseline: 0.77 m/s with SPC, 0.83 m/s without SPC Goal status: INITIAL  4.  Patient will reduce timed up and go to <11 seconds to reduce fall risk and demonstrate improved transfer/gait ability. Baseline:  14.4 sec with out SPC, 12.3 sec with SPC Goal status: INITIAL  5.  Patient will increase dynamic gait index score to >19/24 as to demonstrate reduced fall risk and improved dynamic gait  balance for better safety with community/home ambulation.   Baseline: 16/24 Goal status: INITIAL   ASSESSMENT:  CLINICAL IMPRESSION: Increased focus today on SLB activities and progressions as pt reported this has been most challenging part of her HEP. Pt observed to have greatest difficulty sustaining SLB on LLE, did improve with modifications/progressions. Will continue to target this deficit. Additionally, plan to try various [employer] cardio machines next appt. The pt will benefit from further skilled PT to increase strength, balance, gait, mobility and QOL and to decrease fall risk.    OBJECTIVE IMPAIRMENTS: Abnormal gait, decreased activity tolerance, decreased balance, decreased coordination, decreased mobility, difficulty walking, decreased strength, increased muscle spasms, impaired UE functional use, improper body mechanics, postural dysfunction, and pain.   ACTIVITY LIMITATIONS: lifting, standing, squatting, transfers, continence, and locomotion level  PARTICIPATION LIMITATIONS: shopping, community activity, and yard work  PERSONAL FACTORS: Age, Sex, Time since onset of injury/illness/exacerbation, and 3+ comorbidities: PMH significant for DM, anxiety, HTN, neurogenic bladder, SVT, hx partial left lung removal 2016.   are also affecting patient's functional outcome.   REHAB POTENTIAL: Good  CLINICAL DECISION MAKING: Evolving/moderate complexity  EVALUATION COMPLEXITY: Moderate  PLAN:  PT FREQUENCY: 1-2x/week  PT DURATION: 12 weeks  PLANNED INTERVENTIONS: 97164- PT Re-evaluation, 97110-Therapeutic exercises, 97530- Therapeutic activity, 97112- Neuromuscular re-education, 97535- Self Care, 16109- Manual therapy, [phone]- Gait training, [phone]- Orthotic Fit/training, [phone]- Canalith repositioning, [phone]- Splinting, Patient/Family education, Balance training, Stair training, Taping, Joint mobilization, Spinal mobilization, Vestibular training, DME instructions, Cryotherapy, and  Moist heat  PLAN FOR NEXT SESSION: strength, balance, [employer] next visit test safety with row machine, stationary bikes   Baird Kay, PT 03/30/2023, 12:01 PM

## 2023-04-06 ENCOUNTER — Encounter: Payer: Self-pay | Admitting: Internal Medicine

## 2023-04-06 ENCOUNTER — Ambulatory Visit: Payer: 59 | Admitting: Internal Medicine

## 2023-04-06 VITALS — BP 120/82 | HR 70 | Temp 98.0°F | Resp 16 | Ht 66.0 in | Wt 197.5 lb

## 2023-04-06 DIAGNOSIS — E785 Hyperlipidemia, unspecified: Secondary | ICD-10-CM

## 2023-04-06 DIAGNOSIS — I10 Essential (primary) hypertension: Secondary | ICD-10-CM

## 2023-04-06 DIAGNOSIS — E871 Hypo-osmolality and hyponatremia: Secondary | ICD-10-CM

## 2023-04-06 DIAGNOSIS — N319 Neuromuscular dysfunction of bladder, unspecified: Secondary | ICD-10-CM

## 2023-04-06 DIAGNOSIS — R7303 Prediabetes: Secondary | ICD-10-CM | POA: Diagnosis not present

## 2023-04-06 DIAGNOSIS — E669 Obesity, unspecified: Secondary | ICD-10-CM

## 2023-04-06 DIAGNOSIS — Z23 Encounter for immunization: Secondary | ICD-10-CM | POA: Diagnosis not present

## 2023-04-06 LAB — LIPID PANEL
Cholesterol: 213 mg/dL — ABNORMAL HIGH (ref 0–200)
HDL: 57.6 mg/dL (ref >39.00)
LDL Cholesterol: 136 mg/dL — ABNORMAL HIGH (ref 0–99)
NonHDL: 155.03
Total CHOL/HDL Ratio: 4
Triglycerides: 93 mg/dL (ref 0.0–149.0)
VLDL: 18.6 mg/dL (ref 0.0–40.0)

## 2023-04-06 LAB — COMPREHENSIVE METABOLIC PANEL
ALT: 17 U/L (ref 0–35)
AST: 24 U/L (ref 0–37)
Albumin: 4.5 g/dL (ref 3.5–5.2)
Alkaline Phosphatase: 61 U/L (ref 39–117)
BUN: 18 mg/dL (ref 6–23)
CO2: 29 meq/L (ref 19–32)
Calcium: 9.7 mg/dL (ref 8.4–10.5)
Chloride: 94 meq/L — ABNORMAL LOW (ref 96–112)
Creatinine, Ser: 0.73 mg/dL (ref 0.40–1.20)
GFR: 90.15 mL/min (ref >60.00)
Glucose, Bld: 89 mg/dL (ref 70–99)
Potassium: 4 meq/L (ref 3.5–5.1)
Sodium: 130 meq/L — ABNORMAL LOW (ref 135–145)
Total Bilirubin: 0.6 mg/dL (ref 0.2–1.2)
Total Protein: 7.6 g/dL (ref 6.0–8.3)

## 2023-04-06 LAB — URINALYSIS, ROUTINE W REFLEX MICROSCOPIC
Bilirubin Urine: NEGATIVE
Hgb urine dipstick: NEGATIVE
Ketones, ur: NEGATIVE
Leukocytes,Ua: NEGATIVE
Nitrite: NEGATIVE
Specific Gravity, Urine: 1.015 (ref 1.000–1.030)
Total Protein, Urine: NEGATIVE
Urine Glucose: NEGATIVE
Urobilinogen, UA: 0.2 (ref 0.0–1.0)
pH: 7 (ref 5.0–8.0)

## 2023-04-06 LAB — HEMOGLOBIN A1C: Hgb A1c MFr Bld: 5.9 % (ref 4.6–6.5)

## 2023-04-06 LAB — LDL CHOLESTEROL, DIRECT: Direct LDL: 118 mg/dL

## 2023-04-06 MED ORDER — OXYBUTYNIN CHLORIDE ER 5 MG PO TB24: 5.0000 mg | ORAL_TABLET | Freq: Every day | ORAL | 1 refills | Status: DC | PRN

## 2023-04-06 NOTE — Progress Notes (Signed)
Subjective:  Patient ID: Natalie Pugh, female    DOB: May 21, 1965  Age: 57 y.o. MRN: [id number]  CC: The primary encounter diagnosis was Elevated blood pressure reading in office with white coat syndrome, with diagnosis of hypertension. Diagnoses of Prediabetes, Hyperlipidemia, unspecified hyperlipidemia type, Neurogenic bladder, Hyponatremia, and Obesity (BMI 30-39.9) were also pertinent to this visit.   HPI Natalie Pugh presents for  Chief Complaint  Patient presents with   Medical Management of Chronic Issues    6 Month follow up     1) Hypertension: patient checks blood pressure daily at home.  Readings have been for the most part <130/80 at rest . Patient is following a reduced salt diet most days and is taking medications as prescribed .  Considering  an exercise bike or rowing machine.    2) Obesity:  has lost 6 lbs since last visit 6 months ago,  intermittent fasting ,  high protein , low carb diet .  Using the [employer] for desserts   3) MS:  MRI brain and cervical spine were  repeated by Dr Sherryll Burger in October .  No changes since 2018.Marland Kitchen  Getting PT for balance improvement  4) Right maxillary sinusitis was noted on the MRI done Oct 16, which she states was symptomatic but resolved without abx. Still hasving sinus drainage,  and PND.  All drainage is clear,  no fevers or malaise  5) insomnia/anxiety  ;  using 1/2 alprazolam as needed,  not daily   6) overactive bladder:   often has nocturia x  3 voids per night.  Frequency alternates .  Not consistent.   Drinks 20 ounces of coffee daily ,  caffeinated .  Thinking of restarting the oxybutynin  (hasn't taken it in years).   Drinks 20 ounces of caffeinated coffee daily     Outpatient Medications Prior to Visit  Medication Sig Dispense Refill   ALPRAZolam (XANAX) 0.5 MG tablet Take 2 tablets (1 mg total) by mouth 2 (two) times daily as needed for anxiety or sleep. 60 tablet 5   Cholecalciferol (VITAMIN D3) 1000 UNITS  CAPS Take 2 capsules by mouth daily.      Cranberry 250 MG CAPS Take 2 capsules by mouth daily.     metoprolol succinate (TOPROL-XL) 50 MG 24 hr tablet TAKE 1 TABLET BY MOUTH DAILY  WITH OR IMMEDIATELY FOLLOWING A  MEAL 90 tablet 3   Multiple Vitamin (MULTIVITAMIN) tablet Take 1 tablet by mouth daily.     oxybutynin (DITROPAN-XL) 5 MG 24 hr tablet Take 1 tablet (5 mg total) by mouth daily as needed. 90 tablet 1   telmisartan-hydrochlorothiazide (MICARDIS HCT) 40-12.5 MG tablet TAKE 1 TABLET BY MOUTH DAILY 90 tablet 3   estradiol (ESTRACE) 0.1 MG/GM vaginal cream Place 1 Applicatorful vaginally at bedtime. FOR 14 DAYS, then twice weekly thereafter (Patient not taking: Reported on 04/06/2023) 42.5 g 12   No facility-administered medications prior to visit.    Review of Systems;  Patient denies headache, fevers, malaise, unintentional weight loss, skin rash, eye pain, sinus congestion and sinus pain, sore throat, dysphagia,  hemoptysis , cough, dyspnea, wheezing, chest pain, palpitations, orthopnea, edema, abdominal pain, nausea, melena, diarrhea, constipation, flank pain, dysuria, hematuria, urinary  Frequency, nocturia, numbness, tingling, seizures,  Focal weakness, Loss of consciousness,  Tremor, insomnia, depression, anxiety, and suicidal ideation.      Objective:  BP 120/82   Pulse 70   Temp 98 F (36.7 C)   Resp 16  Ht 5\' 6"  (1.676 m)   Wt 197 lb 8 oz (89.6 kg)   LMP 09/28/2014 (Exact Date)   SpO2 100%   BMI 31.88 kg/m   BP Readings from Last 3 Encounters:  04/06/23 120/82  09/01/22 110/81  07/31/21 136/70    Wt Readings from Last 3 Encounters:  04/06/23 197 lb 8 oz (89.6 kg)  09/01/22 203 lb (92.1 kg)  07/31/21 210 lb (95.3 kg)    Physical Exam Vitals reviewed.  Constitutional:      General: She is not in acute distress.    Appearance: Normal appearance. She is normal weight. She is not ill-appearing, toxic-appearing or diaphoretic.  HENT:     Head: Normocephalic.   Eyes:     General: No scleral icterus.       Right eye: No discharge.        Left eye: No discharge.     Conjunctiva/sclera: Conjunctivae normal.  Cardiovascular:     Rate and Rhythm: Normal rate and regular rhythm.     Heart sounds: Normal heart sounds.  Pulmonary:     Effort: Pulmonary effort is normal. No respiratory distress.     Breath sounds: Normal breath sounds.  Musculoskeletal:        General: Normal range of motion.  Skin:    General: Skin is warm and dry.  Neurological:     General: No focal deficit present.     Mental Status: She is alert and oriented to person, place, and time. Mental status is at baseline.  Psychiatric:        Mood and Affect: Mood normal.        Behavior: Behavior normal.        Thought Content: Thought content normal.        Judgment: Judgment normal.    Lab Results  Component Value Date   HGBA1C 5.9 04/06/2023   HGBA1C 5.8 09/01/2022   HGBA1C 5.9 07/31/2021    Lab Results  Component Value Date   CREATININE 0.73 04/06/2023   CREATININE 0.69 09/01/2022   CREATININE 0.66 07/31/2021    Lab Results  Component Value Date   WBC 5.0 09/01/2022   HGB 13.7 09/01/2022   HCT 40.7 09/01/2022   PLT 309.0 09/01/2022   GLUCOSE 89 04/06/2023   CHOL 213 (H) 04/06/2023   TRIG 93.0 04/06/2023   HDL 57.60 04/06/2023   LDLDIRECT 118.0 04/06/2023   LDLCALC 136 (H) 04/06/2023   ALT 17 04/06/2023   AST 24 04/06/2023   NA 130 (L) 04/06/2023   K 4.0 04/06/2023   CL 94 (L) 04/06/2023   CREATININE 0.73 04/06/2023   BUN 18 04/06/2023   CO2 29 04/06/2023   TSH 0.96 09/01/2022   HGBA1C 5.9 04/06/2023   MICROALBUR <0.7 09/01/2022    MR BRAIN W WO CONTRAST  Result Date: 03/20/2023 CLINICAL DATA:  Follow-up multiple sclerosis EXAM: MRI HEAD WITHOUT AND WITH CONTRAST TECHNIQUE: Multiplanar, multiecho pulse sequences of the brain and surrounding structures were obtained without and with intravenous contrast. CONTRAST:  10 cc view a COMPARISON:   01/04/2017 FINDINGS: Brain: Diffusion imaging does not show any acute or subacute infarction or other cause of restricted diffusion. No abnormality is seen affecting the brainstem or cerebellum. Cerebral hemispheres show only a few punctate foci of T2 and FLAIR signal within the white matter, similar in degree to the prior study without any new or progressive lesions. No abnormal contrast enhancement occurs. No mass, hemorrhage, hydrocephalus or extra-axial collection. Vascular: Major vessels  at the base of the brain show flow. Skull and upper cervical spine: Negative Sinuses/Orbits: Orbits appear normal. There is acute rhinosinusitis of the right maxillary sinus with mucosal thickening and a fluid level. Other: None IMPRESSION: 1. No change since 01/04/2017. Only a few punctate foci of T2 and FLAIR signal within the cerebral hemispheric white matter, similar in degree to the prior study. No new or progressive lesions. No abnormal contrast enhancement. 2. Acute rhinosinusitis of the right maxillary sinus. Electronically Signed   By: Paulina Fusi M.D.   On: 03/20/2023 15:25   MR CERVICAL SPINE W WO CONTRAST  Result Date: 03/20/2023 CLINICAL DATA:  Follow-up multiple sclerosis. Worsening weakness, left worse than right. EXAM: MRI CERVICAL SPINE WITHOUT AND WITH CONTRAST TECHNIQUE: Multiplanar and multiecho pulse sequences of the cervical spine, to include the craniocervical junction and cervicothoracic junction, were obtained without and with intravenous contrast. CONTRAST:  10 cc [employer] COMPARISON:  01/04/2017 FINDINGS: Alignment: Normal Vertebrae: No fracture or focal bone lesion. Cord: No cord compression. Allowing for mild artifact, there are no signs of focal demyelinating disease affecting the cervical cord. No abnormal enhancement. Posterior Fossa, vertebral arteries, paraspinal tissues: See results of brain MRI. Disc levels: The foramen magnum is widely patent. There is ordinary mild osteoarthritis of the  C1-2 articulation but no encroachment upon the neural structures. C2-3 and C3-4: Normal. C4-5, C5-6 and C6-7: Endplate osteophytes and bulging of the disc. Narrowing of the ventral subarachnoid space but no compressive effect upon the cord. Bilateral foraminal narrowing at those levels. Minimal worsening since 2018. IMPRESSION: 1. No evidence of focal demyelinating disease affecting the cervical cord. 2. 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 Narrowing of the ventral subarachnoid space but no compressive effect upon the cord. Bilateral foraminal narrowing at those levels. Minimal worsening since 2018. Electronically Signed   By: Paulina Fusi M.D.   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  Weight gain addressed.  I reviewed  her diet and exercise regimen, providing  counselling about the the role of diet and exercise  In preventing progression from prediabetes to diabetes,and making suggestions on how to modify her current lifestyle.     Lab Results  Component Value Date   HGBA1C 5.9 04/06/2023     Orders: -     Comprehensive metabolic panel -     Hemoglobin A1c  Hyperlipidemia, unspecified hyperlipidemia type -     Lipid panel -     LDL cholesterol, direct  Neurogenic bladder -     Urinalysis, Routine w reflex microscopic -     Urine Culture  Hyponatremia Assessment & Plan: Chronic, worsening.  Noted first in 2022 after  addition of hydrochlorothiazide in March 2022 for htn control.  Will recommend change in therapy  Lab Results  Component Value Date   NA 130 (L) 04/06/2023   K 4.0  04/06/2023   CL 94 (L) 04/06/2023   CO2 29 04/06/2023      Obesity (BMI 30-39.9) Assessment & Plan:   I have addressed  BMI and recommended a low glycemic index diet utilizing smaller more frequent meals to increase metabolism.  I have also recommended that patient increase her exercise with a goal of 30 minutes of aerobic exercise a minimum of 5 days per week. Lab Results  Component Value Date   TSH 0.96 09/01/2022   Lab Results  Component Value Date   HGBA1C 5.9 04/06/2023   Lab Results  Component Value Date   CHOL 213 (H) 04/06/2023   HDL 57.60 04/06/2023   LDLCALC 136 (H) 04/06/2023   LDLDIRECT 118.0 04/06/2023   TRIG 93.0 04/06/2023   CHOLHDL 4 04/06/2023        Other orders -     oxyBUTYnin Chloride ER; Take 1 tablet (5 mg total) by mouth daily as needed.  Dispense: 90 tablet; Refill: 1 -     Tdap vaccine greater than or equal to 7yo IM -     Telmisartan; Take 1 tablet (80 mg total) by mouth daily.  Dispense: 90 tablet; Refill: 1     I provided 40 minutes on the day of this of face-to-face time during this encounter reviewing patient's last visit with me, patient's  most recent visit with neurology,  recent surgical and non surgical procedures, previous  labs and imaging studies, counseling on currently addressed issues,  and post visit ordering to diagnostics and therapeutics .   Follow-up: Return in about 6 months (around 10/04/2023).   Sherlene Shams, MD

## 2023-04-06 NOTE — Patient Instructions (Addendum)
Your overactive bladder may be aggravated by your caffeine intake.  I recommed that you Mix decaf and caffeinated coffee to  reduce your daily total caffeine load by 50%   Ok to resume oxybutynin (I have refilled)   Congrats on the weight loss!    A Rowing machine is a GREAT choice for full body exercise and low impact cardio  May you feel the blessing of our God, the love of [address], and the comfort of the [employer] this week and in the weeks to come.  Happy Thanksgiving!  Regards,   Duncan Dull, MD

## 2023-04-07 LAB — URINE CULTURE
MICRO NUMBER:: 15787822
Result:: NO GROWTH
SPECIMEN QUALITY:: ADEQUATE

## 2023-04-08 MED ORDER — TELMISARTAN 80 MG PO TABS: 80.0000 mg | ORAL_TABLET | Freq: Every day | ORAL | 1 refills | Status: DC

## 2023-04-08 NOTE — Assessment & Plan Note (Signed)
I have addressed  BMI and recommended a low glycemic index diet utilizing smaller more frequent meals to increase metabolism.  I have also recommended that patient increase her exercise with a goal of 30 minutes of aerobic exercise a minimum of 5 days per week. Lab Results  Component Value Date   TSH 0.96 09/01/2022   Lab Results  Component Value Date   HGBA1C 5.9 04/06/2023   Lab Results  Component Value Date   CHOL 213 (H) 04/06/2023   HDL 57.60 04/06/2023   LDLCALC 136 (H) 04/06/2023   LDLDIRECT 118.0 04/06/2023   TRIG 93.0 04/06/2023   CHOLHDL 4 04/06/2023

## 2023-04-08 NOTE — Assessment & Plan Note (Addendum)
Chronic, worsening.  Noted first in 2022 after  addition of hydrochlorothiazide in March 2022 for htn control.  Will recommend change in therapy  Lab Results  Component Value Date   NA 130 (L) 04/06/2023   K 4.0 04/06/2023   CL 94 (L) 04/06/2023   CO2 29 04/06/2023

## 2023-04-08 NOTE — Assessment & Plan Note (Signed)
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

## 2023-04-08 NOTE — Assessment & Plan Note (Signed)
A1c   is stable.  Weight gain addressed.  I reviewed  her diet and exercise regimen, providing  counselling about the the role of diet and exercise  In preventing progression from prediabetes to diabetes,and making suggestions on how to modify her current lifestyle.     Lab Results  Component Value Date   HGBA1C 5.9 04/06/2023

## 2023-04-13 ENCOUNTER — Ambulatory Visit: Payer: 59 | Attending: Neurology

## 2023-04-13 DIAGNOSIS — R2681 Unsteadiness on feet: Secondary | ICD-10-CM | POA: Diagnosis present

## 2023-04-13 DIAGNOSIS — R262 Difficulty in walking, not elsewhere classified: Secondary | ICD-10-CM | POA: Insufficient documentation

## 2023-04-13 DIAGNOSIS — M6281 Muscle weakness (generalized): Secondary | ICD-10-CM | POA: Insufficient documentation

## 2023-04-13 DIAGNOSIS — R278 Other lack of coordination: Secondary | ICD-10-CM | POA: Insufficient documentation

## 2023-04-13 NOTE — Therapy (Signed)
OUTPATIENT PHYSICAL THERAPY NEURO TREATMENT NOTE   Patient Name: Natalie Pugh MRN: [id number] DOB:[birth year], 57 y.o., female Today's Date: 04/13/2023   PCP:    Sherlene Shams, MD   REFERRING PROVIDER:    Sherlene Shams, MD    END OF SESSION:  PT End of Session - 04/13/23 0936     Visit Number 4    Number of Visits 25    Date for PT Re-Evaluation 06/08/23    PT Start Time 0934    PT Stop Time 1017    PT Time Calculation (min) 43 min    Equipment Utilized During Treatment Gait belt    Activity Tolerance Patient tolerated treatment well    Behavior During Therapy [facility] for tasks assessed/performed               Past Medical History:  Diagnosis Date   Anxiety    Diabetes mellitus    Hypertension    Multiple sclerosis, relapsing-remitting (HCC) 2001   managed since 2001,  present since 1994,  Dr. Gardiner Ramus Neurolgoy   Neurogenic bladder    SVT (supraventricular tachycardia) [facility])    Past Surgical History:  Procedure Laterality Date   COLONOSCOPY WITH PROPOFOL N/A 01/29/2015   Procedure: COLONOSCOPY WITH PROPOFOL;  Surgeon: Earline Mayotte, MD;  Location: [facility] ENDOSCOPY;  Service: Endoscopy;  Laterality: N/A;   LUNG REMOVAL, PARTIAL Left 2016   Patient Active Problem List   Diagnosis Date Noted   Neurogenic bladder 09/01/2022   Tinnitus aurium, bilateral 09/01/2022   Nocturia 01/19/2021   Hyponatremia 07/27/2020   Primary insomnia 06/21/2020   Fatigue 03/11/2019   Right anterior shoulder pain 12/29/2017   Venous insufficiency of both lower extremities 07/17/2017   Foraminal stenosis of cervical region 04/10/2017   Constipation 04/10/2017   Encounter for screening colonoscopy 12/03/2014   Encounter for preventive health examination 11/24/2014   Prediabetes    Generalized anxiety disorder    Elevated blood pressure reading in office with white coat syndrome, with diagnosis of hypertension 09/23/2011   Obesity (BMI 30-39.9) 09/23/2011    Multiple sclerosis, relapsing-remitting (HCC)     ONSET DATE: 2001  REFERRING DIAG: G35 (ICD-10-CM) - Multiple sclerosis   THERAPY DIAG:  Muscle weakness (generalized)  Unsteadiness on feet  Other lack of coordination  Difficulty in walking, not elsewhere classified  Rationale for Evaluation and Treatment: Rehabilitation  SUBJECTIVE:                                                                                                                                                                                             SUBJECTIVE  STATEMENT: Pt reports performing HEP. She had a good Thanksgiving. She has days where she feels stronger and days where she feels off. She describes fatigue while walking when she went shopping yesterday.  She reports she has noticed issues with maintaining good posture.  Pt accompanied by: self  PERTINENT HISTORY:  Pt is a pleasant 57 yo female referred for impairments due to relapsing, remitting MS. Pt diagnosed in 2001. She reports decreased strength, balance and mobility, left side is most affected. Pt uses SPC, but does have RW for long distances, and trekking poles. She is concerned about staggering gait and at times must grab onto furniture to steady herself. She reports no dizziness. She has not fallen in the past 6 months, but has had maybe two falls in the past year. She says falls are due to L foot drag. Pt does not have pain currently but can have BLE spasms/cramps. She has neurogenic bladder, self-caths.  Pt doing exercises given by neuro 5x/week for LE strengthening. She thinks she can walk up to 10 maybe 15 min with her walker. Pt is concerned about LUE function, but would like to focus on PT first before pursuing OT. PMH significant for DM, anxiety, HTN, neurogenic bladder, SVT, hx partial left lung removal 2016. See chart for full details  PAIN:  Are you having pain? No  PRECAUTIONS: None  RED FLAGS: Bowel or bladder incontinence: Yes:  neurogenic bladder    WEIGHT BEARING RESTRICTIONS: No  FALLS: Has patient fallen in last 6 months? No  LIVING ENVIRONMENT: Lives with: lives with their family, spouse Lives in: House/apartment - one story house Stairs:  one step to enter  Has following equipment at home: Single point cane, Environmental consultant - 4 wheeled, and walking poles   PLOF: Independent  PATIENT GOALS: Improve my walking, strength and mobility  OBJECTIVE:  Note: Objective measures were completed at Evaluation unless otherwise noted.  DIAGNOSTIC FINDINGS:   02/23/2023 MR BRAIN and 02/23/2023 pending results?  MR BRAIN 01/04/2017: "IMPRESSION: 1. Minimal periventricular T2 signal changes in some involvement of the body of the corpus callosum could be related to a demyelinating process. These changes are nonspecific and can be seen in relation to chronic microvascular ischemia, prior infection, or inflammation. 2. No focal signal abnormality within the cervical spinal cord. 3. Mild right foraminal narrowing at C3-4 is worse on the right. 4. Moderate right foraminal stenosis at C4-5. 5. Severe right and moderate left foraminal stenosis at C5-6 and C6-7. 6. The right central canal narrowing at C5-6 and C6-7.     Electronically Signed   By: Marin Roberts M.D.   On: 01/04/2017 13:24"  COGNITION: Overall cognitive status: Within functional limits for tasks assessed   SENSATION: Pt reports n/t on and off in her hands and toes   COORDINATION:   WNL UE with rapid alt movement and WNL LE heel>shin    POSTURE: rounded shoulders and increased thoracic kyphosis   LOWER EXTREMITY MMT:    Gross strength is 4+/5 RLE, 4/5 LLE    TRANSFERS: Assistive device utilized: None  Sit to stand: Complete Independence Stand to sit: Complete Independence Chair to chair: Complete Independence   STAIRS: Level of Assistance: Modified independence Stair Negotiation Technique: Step to Pattern Alternating Pattern   with Bilateral Rails Number of Stairs: 4    GAIT: Gait pattern:  Decreased gait speed, increased variability in BOS, impaired balance with head turns, decreased heel strike bilat, increased knee ext. with mid-stance bilat Distance walked: 10  meters/clinic distances Assistive device utilized: Single point cane and no AD with SBA-CGA   FUNCTIONAL TESTS:  5 times sit to stand: 13.5 sec  6 minute walk test: deferred 10 meter walk test: 0.77 m/s with SPC and 0.83 without SPC  Dynamic Gait Index: 16/24 TUG: 14.4 sec without SPC, 12.3 sec with SPC   PATIENT SURVEYS:  ABC scale deferred FOTO 55 - risk adjusted 46 (goal 56)  TODAY'S TREATMENT:                                                                                                                              DATE: 04/13/23  TA: Review and practice using [employer] machines, instruction in technique safely getting on/off machines: several minutes of the following -[employer] -[employer]ower  Comments: Pt shows good stability with both and able to demo safe/correct technique with mount/dismount of machines   NMR: Several minutes of the following/multiple reps: -One foot on floor, one foot on 6" step with decreasing levels of UE support, each LE completed in 30-45 sec bouts -SLB with UE support - each LE completed in 30 sec bouts -Soccer ball SLB side-to-side, cw/cc with UE support - very challenging  -Cone taps (3 cones, one on 6" step) - challenge with eccentric control --Cone taps (3 cones all on floor) -- continued challenge w eccentric control but improves w/ reps    PATIENT EDUCATION: Education details: exercise technique Person educated: Patient and Spouse Education method: Explanation, Demonstration, and Verbal cues Education comprehension: verbalized understanding, returned demonstration, verbal cues required, and needs further education  HOME EXERCISE PROGRAM: Access Code: [email or web address] URL:  [email or web address] Date: 03/23/2023 Prepared by: Temple Pacini  Exercises - Standing Tandem Balance with Counter Support  - 2 x daily - 7 x weekly - 2 sets - 1 reps - 30 sec/leg hold - Standing Single Leg Stance with Counter Support  - 2 x daily - 7 x weekly - 2 sets - 1 reps - 30 sec/leg  hold - Standing Near Stance in Corner with Eyes Closed  - 2 x daily - 7 x weekly - 2 sets - 1 reps - 30 seconds hold - Sit to Stand with Counter Support  - 1 x daily - 4-5 x weekly - 2 sets - 10-15 reps - Seated March  - 1 x daily - 4-5 x weekly - 2 sets - 10-15 reps  GOALS: Goals reviewed with patient? Yes   SHORT TERM GOALS: Target date: 04/27/2023    Patient will be independent in home exercise program to improve balance, strength/mobility for better functional independence with ADLs. Baseline: initiated  Goal status: INITIAL   LONG TERM GOALS: Target date: 06/08/2023   Patient will increase FOTO score to equal to or greater than  56   to demonstrate statistically significant improvement in mobility and quality of life.  Baseline: 46 risk-adjusted Goal status: INITIAL  2.  Patient (< 54 years old) will complete five times  sit to stand test in < 10 seconds indicating an increased LE strength and improved balance. Baseline: 13.5 sec (avg of two trials) Goal status: INITIAL   3.  Patient will increase 10 meter walk test to >1.64m/s as to improve gait speed for better community ambulation and to reduce fall risk. Baseline: 0.77 m/s with SPC, 0.83 m/s without SPC Goal status: INITIAL  4.  Patient will reduce timed up and go to <11 seconds to reduce fall risk and demonstrate improved transfer/gait ability. Baseline:  14.4 sec with out SPC, 12.3 sec with SPC Goal status: INITIAL  5.  Patient will increase dynamic gait index score to >19/24 as to demonstrate reduced fall risk and improved dynamic gait balance for better safety with community/home ambulation.   Baseline:  16/24 Goal status: INITIAL   ASSESSMENT:  CLINICAL IMPRESSION: Pt able to demonstrate safe and correct technique with [employer] machines today, and also exhibited good balance with mount/dismount. Continued focus on SLB activities as this remains challenging. Plan to retest goals next visit and update HEP.  The pt will benefit from further skilled PT to increase strength, balance, gait, mobility and QOL and to decrease fall risk.    OBJECTIVE IMPAIRMENTS: Abnormal gait, decreased activity tolerance, decreased balance, decreased coordination, decreased mobility, difficulty walking, decreased strength, increased muscle spasms, impaired UE functional use, improper body mechanics, postural dysfunction, and pain.   ACTIVITY LIMITATIONS: lifting, standing, squatting, transfers, continence, and locomotion level  PARTICIPATION LIMITATIONS: shopping, community activity, and yard work  PERSONAL FACTORS: Age, Sex, Time since onset of injury/illness/exacerbation, and 3+ comorbidities: PMH significant for DM, anxiety, HTN, neurogenic bladder, SVT, hx partial left lung removal 2016.   are also affecting patient's functional outcome.   REHAB POTENTIAL: Good  CLINICAL DECISION MAKING: Evolving/moderate complexity  EVALUATION COMPLEXITY: Moderate  PLAN:  PT FREQUENCY: 1-2x/week  PT DURATION: 12 weeks  PLANNED INTERVENTIONS: 97164- PT Re-evaluation, 97110-Therapeutic exercises, 97530- Therapeutic activity, 97112- Neuromuscular re-education, 97535- Self Care, 09811- Manual therapy, [phone]- Gait training, [phone]- Orthotic Fit/training, [phone]- Canalith repositioning, [phone]- Splinting, Patient/Family education, Balance training, Stair training, Taping, Joint mobilization, Spinal mobilization, Vestibular training, DME instructions, Cryotherapy, and Moist heat  PLAN FOR NEXT SESSION: walking balance, SLB progression, RETEST GOALS/update HEP   Baird Kay, PT 04/13/2023, 10:24 AM

## 2023-04-20 ENCOUNTER — Encounter: Payer: 59 | Admitting: Occupational Therapy

## 2023-04-20 ENCOUNTER — Ambulatory Visit: Payer: 59

## 2023-04-20 DIAGNOSIS — R262 Difficulty in walking, not elsewhere classified: Secondary | ICD-10-CM

## 2023-04-20 DIAGNOSIS — M6281 Muscle weakness (generalized): Secondary | ICD-10-CM | POA: Diagnosis not present

## 2023-04-20 DIAGNOSIS — R278 Other lack of coordination: Secondary | ICD-10-CM

## 2023-04-20 DIAGNOSIS — R2681 Unsteadiness on feet: Secondary | ICD-10-CM

## 2023-04-20 NOTE — Therapy (Signed)
OUTPATIENT PHYSICAL THERAPY NEURO TREATMENT NOTE   Patient Name: KYNEDI LAMME MRN: [id number] DOB:Jul 12, 1965, 57 y.o., female Today's Date: 04/20/2023   PCP:    Sherlene Shams, MD   REFERRING PROVIDER:    Sherlene Shams, MD    END OF SESSION:  PT End of Session - 04/20/23 1348     Visit Number 5    Number of Visits 25    Date for PT Re-Evaluation 06/08/23    PT Start Time 0932    PT Stop Time 1015    PT Time Calculation (min) 43 min    Equipment Utilized During Treatment Gait belt    Activity Tolerance Patient tolerated treatment well    Behavior During Therapy WFL for tasks assessed/performed                Past Medical History:  Diagnosis Date   Anxiety    Diabetes mellitus    Hypertension    Multiple sclerosis, relapsing-remitting (HCC) 2001   managed since 2001,  present since 1994,  Dr. Gardiner Ramus Neurolgoy   Neurogenic bladder    SVT (supraventricular tachycardia) [facility])    Past Surgical History:  Procedure Laterality Date   COLONOSCOPY WITH PROPOFOL N/A 01/29/2015   Procedure: COLONOSCOPY WITH PROPOFOL;  Surgeon: Earline Mayotte, MD;  Location: [facility] ENDOSCOPY;  Service: Endoscopy;  Laterality: N/A;   LUNG REMOVAL, PARTIAL Left 2016   Patient Active Problem List   Diagnosis Date Noted   Neurogenic bladder 09/01/2022   Tinnitus aurium, bilateral 09/01/2022   Nocturia 01/19/2021   Hyponatremia 07/27/2020   Primary insomnia 06/21/2020   Fatigue 03/11/2019   Right anterior shoulder pain 12/29/2017   Venous insufficiency of both lower extremities 07/17/2017   Foraminal stenosis of cervical region 04/10/2017   Constipation 04/10/2017   Encounter for screening colonoscopy 12/03/2014   Encounter for preventive health examination 11/24/2014   Prediabetes    Generalized anxiety disorder    Elevated blood pressure reading in office with white coat syndrome, with diagnosis of hypertension 09/23/2011   Obesity (BMI 30-39.9) 09/23/2011    Multiple sclerosis, relapsing-remitting (HCC)     ONSET DATE: 2001  REFERRING DIAG: G35 (ICD-10-CM) - Multiple sclerosis   THERAPY DIAG:  Unsteadiness on feet  Muscle weakness (generalized)  Difficulty in walking, not elsewhere classified  Other lack of coordination  Rationale for Evaluation and Treatment: Rehabilitation  SUBJECTIVE:  SUBJECTIVE STATEMENT: Pt reports today is probably her last PT appointment due to other appointment conflict next week. Pt says she still has some trouble with LLE SLB cone tap activity, but has been practicing. She feels her endurance is area she needs to improve most. Pt says she can walk for 20-30 min prior to needing a rest break, perhaps more. Pt accompanied by: self  PERTINENT HISTORY:  Pt is a pleasant 56 yo female referred for impairments due to relapsing, remitting MS. Pt diagnosed in 2001. She reports decreased strength, balance and mobility, left side is most affected. Pt uses SPC, but does have RW for long distances, and trekking poles. She is concerned about staggering gait and at times must grab onto furniture to steady herself. She reports no dizziness. She has not fallen in the past 6 months, but has had maybe two falls in the past year. She says falls are due to L foot drag. Pt does not have pain currently but can have BLE spasms/cramps. She has neurogenic bladder, self-caths.  Pt doing exercises given by neuro 5x/week for LE strengthening. She thinks she can walk up to 10 maybe 15 min with her walker. Pt is concerned about LUE function, but would like to focus on PT first before pursuing OT. PMH significant for DM, anxiety, HTN, neurogenic bladder, SVT, hx partial left lung removal 2016. See chart for full details  PAIN:  Are you having pain?  No  PRECAUTIONS: None  RED FLAGS: Bowel or bladder incontinence: Yes: neurogenic bladder    WEIGHT BEARING RESTRICTIONS: No  FALLS: Has patient fallen in last 6 months? No  LIVING ENVIRONMENT: Lives with: lives with their family, spouse Lives in: House/apartment - one story house Stairs:  one step to enter  Has following equipment at home: Single point cane, Environmental consultant - 4 wheeled, and walking poles   PLOF: Independent  PATIENT GOALS: Improve my walking, strength and mobility  OBJECTIVE:  Note: Objective measures were completed at Evaluation unless otherwise noted.  DIAGNOSTIC FINDINGS:   02/23/2023 MR BRAIN and 02/23/2023 pending results?  MR BRAIN 01/04/2017: "IMPRESSION: 1. Minimal periventricular T2 signal changes in some involvement of the body of the corpus callosum could be related to a demyelinating process. These changes are nonspecific and can be seen in relation to chronic microvascular ischemia, prior infection, or inflammation. 2. No focal signal abnormality within the cervical spinal cord. 3. Mild right foraminal narrowing at C3-4 is worse on the right. 4. Moderate right foraminal stenosis at C4-5. 5. Severe right and moderate left foraminal stenosis at C5-6 and C6-7. 6. The right central canal narrowing at C5-6 and C6-7.     Electronically Signed   By: Marin Roberts M.D.   On: 01/04/2017 13:24"  COGNITION: Overall cognitive status: Within functional limits for tasks assessed   SENSATION: Pt reports n/t on and off in her hands and toes   COORDINATION:   WNL UE with rapid alt movement and WNL LE heel>shin    POSTURE: rounded shoulders and increased thoracic kyphosis   LOWER EXTREMITY MMT:    Gross strength is 4+/5 RLE, 4/5 LLE    TRANSFERS: Assistive device utilized: None  Sit to stand: Complete Independence Stand to sit: Complete Independence Chair to chair: Complete Independence   STAIRS: Level of Assistance: Modified  independence Stair Negotiation Technique: Step to Pattern Alternating Pattern  with Bilateral Rails Number of Stairs: 4    GAIT: Gait pattern:  Decreased gait speed, increased variability in BOS, impaired  balance with head turns, decreased heel strike bilat, increased knee ext. with mid-stance bilat Distance walked: 10 meters/clinic distances Assistive device utilized: Single point cane and no AD with SBA-CGA   FUNCTIONAL TESTS:  5 times sit to stand: 13.5 sec  6 minute walk test: deferred 10 meter walk test: 0.77 m/s with SPC and 0.83 without SPC  Dynamic Gait Index: 16/24 TUG: 14.4 sec without SPC, 12.3 sec with SPC   PATIENT SURVEYS:  ABC scale deferred FOTO 55 - risk adjusted 46 (goal 56)  TODAY'S TREATMENT:                                                                                                                              DATE: 04/20/23   TA: Goal reassessment completed, refer to goal section & assessment below for details.  Added to HEP & discussed in session  Access Code: [email or web address] URL: [email or web address] Date: 04/20/2023 Prepared by: Temple Pacini  Exercises - Walking  - 1 x daily - 3-5 x weekly - 1-2 sets - 1 reps - 5 minutes hold Aim for moderate intensity and/or heart rate range 95-129 bpm, where 129 bpm is upper limit/may feelmore intense     PATIENT EDUCATION: Education details: exercise technique, HEP, goals, plan Person educated: Patient Education method: Explanation, Demonstration, Verbal cues, and Handouts Education comprehension: verbalized understanding, returned demonstration, verbal cues required, and needs further education  HOME EXERCISE PROGRAM: Access Code: [email or web address] URL: [email or web address] Date: 03/23/2023 Prepared by: Temple Pacini  Exercises - Standing Tandem Balance with Counter Support  - 2 x daily - 7 x weekly - 2 sets - 1 reps - 30 sec/leg hold - Standing Single Leg Stance with Counter  Support  - 2 x daily - 7 x weekly - 2 sets - 1 reps - 30 sec/leg  hold - Standing Near Stance in Corner with Eyes Closed  - 2 x daily - 7 x weekly - 2 sets - 1 reps - 30 seconds hold - Sit to Stand with Counter Support  - 1 x daily - 4-5 x weekly - 2 sets - 10-15 reps - Seated March  - 1 x daily - 4-5 x weekly - 2 sets - 10-15 reps  GOALS: Goals reviewed with patient? Yes   SHORT TERM GOALS: Target date: 04/27/2023    Patient will be independent in home exercise program to improve balance, strength/mobility for better functional independence with ADLs. Baseline: initiated; 12/11: indep and consistent in performing HEP  Goal status: MET   LONG TERM GOALS: Target date: 06/08/2023   Patient will increase FOTO score to equal to or greater than  56   to demonstrate statistically significant improvement in mobility and quality of life.  Baseline: 46 risk-adjusted; 12/11: 71 Goal status: MET  2.  Patient (< 15 years old) will complete five times sit to stand test in < 10 seconds indicating an increased LE strength and improved balance. Baseline: 13.5 sec (avg of  two trials); 12/11: 11.1 sec Goal status: MET   3.  Patient will increase 10 meter walk test to >1.1m/s as to improve gait speed for better community ambulation and to reduce fall risk. Baseline: 0.77 m/s with SPC, 0.83 m/s without SPC; 12/11: 1.02 m/s with SPC Goal status: MET  4.  Patient will reduce timed up and go to <11 seconds to reduce fall risk and demonstrate improved transfer/gait ability. Baseline:  14.4 sec with out SPC, 12.3 sec with SPC; 12/11: 10.14 sec Goal status: MET  5.  Patient will increase dynamic gait index score to >19/24 as to demonstrate reduced fall risk and improved dynamic gait balance for better safety with community/home ambulation.   Baseline: 16/24; 04/20/23: 19/24 Goal status: PARTIALLY MET   ASSESSMENT:  CLINICAL IMPRESSION: Goal reassessment completed as today might be last PT session  if pt has schedule conflict next week. Pt has now met 4/5 LTG therapy goals and partially met remaining LTG. This indicates improvements in gait, balance, LE strength, QOL, and decreased fall risk. The pt will benefit from further skilled PT to increase strength, balance, gait, mobility and QOL and to decrease fall risk.    OBJECTIVE IMPAIRMENTS: Abnormal gait, decreased activity tolerance, decreased balance, decreased coordination, decreased mobility, difficulty walking, decreased strength, increased muscle spasms, impaired UE functional use, improper body mechanics, postural dysfunction, and pain.   ACTIVITY LIMITATIONS: lifting, standing, squatting, transfers, continence, and locomotion level  PARTICIPATION LIMITATIONS: shopping, community activity, and yard work  PERSONAL FACTORS: Age, Sex, Time since onset of injury/illness/exacerbation, and 3+ comorbidities: PMH significant for DM, anxiety, HTN, neurogenic bladder, SVT, hx partial left lung removal 2016.   are also affecting patient's functional outcome.   REHAB POTENTIAL: Good  CLINICAL DECISION MAKING: Evolving/moderate complexity  EVALUATION COMPLEXITY: Moderate  PLAN:  PT FREQUENCY: 1-2x/week  PT DURATION: 12 weeks  PLANNED INTERVENTIONS: 97164- PT Re-evaluation, 97110-Therapeutic exercises, 97530- Therapeutic activity, 97112- Neuromuscular re-education, 97535- Self Care, 64403- Manual therapy, [phone]- Gait training, [phone]- Orthotic Fit/training, [phone]- Canalith repositioning, [phone]- Splinting, Patient/Family education, Balance training, Stair training, Taping, Joint mobilization, Spinal mobilization, Vestibular training, DME instructions, Cryotherapy, and Moist heat  PLAN FOR NEXT SESSION: review HEP   Baird Kay, PT 04/20/2023, 3:08 PM

## 2023-04-26 ENCOUNTER — Ambulatory Visit: Payer: 59

## 2023-04-27 ENCOUNTER — Ambulatory Visit: Payer: 59

## 2023-05-05 ENCOUNTER — Encounter: Payer: 59 | Admitting: Occupational Therapy

## 2023-05-09 ENCOUNTER — Telehealth: Payer: Self-pay | Admitting: Internal Medicine

## 2023-05-09 ENCOUNTER — Ambulatory Visit (INDEPENDENT_AMBULATORY_CARE_PROVIDER_SITE_OTHER): Payer: 59

## 2023-05-09 VITALS — BP 138/92 | HR 73

## 2023-05-09 DIAGNOSIS — E871 Hypo-osmolality and hyponatremia: Secondary | ICD-10-CM | POA: Diagnosis not present

## 2023-05-09 DIAGNOSIS — I10 Essential (primary) hypertension: Secondary | ICD-10-CM | POA: Diagnosis not present

## 2023-05-09 NOTE — Telephone Encounter (Signed)
Copied from CRM [id number]. Topic: General - Other >> May 09, 2023  8:26 AM Alphonzo Lemmings O wrote: Reason for CRM: patient has a question about the fasting for the lab. Patient had some super beets and some coffee. How long do I need to fast to have a recheck for my sodium.appointment is for later today . Patient is wanting to know should she reschedule or can she just not eat nothing the rest of the day till her appointment please give patient a call concerning this [phone]

## 2023-05-09 NOTE — Telephone Encounter (Signed)
Spoke with pt and let her know that she does not have to be fasting for her blood work today. Pt gave a verbal understanding.

## 2023-05-09 NOTE — Progress Notes (Cosign Needed)
Patient here for nurse visit BP check per order from Dr Darrick Huntsman.   Patient reports compliance with prescribed BP medications: yes  Last dose of BP medication: Today  BP Readings from Last 3 Encounters:  05/09/23 (!) 138/92  04/06/23 120/82  09/01/22 110/81   Pulse Readings from Last 3 Encounters:  05/09/23 73  04/06/23 70  09/01/22 67   BP on initial check 148/98 Recheck:  138/92  Patient did report some increased anxiety with the holidays but overall reported doing ok and feeling ok. She did want you to know that she has completely cut out caffeine.   BP readings from home 05/02/23-05/09/23: 161/99 P 68 131/91 P 70 143/88 P 65 148/93 P 68 131/88 P 70 127/91 P 68 127/84 P 77 147/101 P 66

## 2023-05-10 LAB — BASIC METABOLIC PANEL
BUN: 15 mg/dL (ref 6–23)
CO2: 27 meq/L (ref 19–32)
Calcium: 9.7 mg/dL (ref 8.4–10.5)
Chloride: 101 meq/L (ref 96–112)
Creatinine, Ser: 0.72 mg/dL (ref 0.40–1.20)
GFR: 91.59 mL/min (ref >60.00)
Glucose, Bld: 93 mg/dL (ref 70–99)
Potassium: 4.1 meq/L (ref 3.5–5.1)
Sodium: 137 meq/L (ref 135–145)

## 2023-05-11 ENCOUNTER — Other Ambulatory Visit: Payer: Self-pay | Admitting: Internal Medicine

## 2023-05-11 ENCOUNTER — Encounter: Payer: Self-pay | Admitting: Internal Medicine

## 2023-05-11 MED ORDER — HYDROCHLOROTHIAZIDE 12.5 MG PO CAPS: 12.5000 mg | ORAL_CAPSULE | Freq: Every day | ORAL | 0 refills | Status: DC

## 2023-05-11 MED ORDER — AMLODIPINE BESYLATE 2.5 MG PO TABS: 2.5000 mg | ORAL_TABLET | Freq: Every day | ORAL | 0 refills | Status: DC

## 2023-05-11 NOTE — Assessment & Plan Note (Signed)
 Adding hydrochlorothiazide 12.5 mg daily to current regiemn of micardis 80 mg and metoprolol 50 mg

## 2023-05-12 ENCOUNTER — Ambulatory Visit: Payer: 59

## 2023-06-06 ENCOUNTER — Other Ambulatory Visit: Payer: Self-pay | Admitting: Internal Medicine

## 2023-06-10 ENCOUNTER — Telehealth: Payer: Self-pay | Admitting: Internal Medicine

## 2023-06-10 MED ORDER — AMLODIPINE BESYLATE 2.5 MG PO TABS: 2.5000 mg | ORAL_TABLET | Freq: Every day | ORAL | 1 refills | Status: DC

## 2023-06-10 NOTE — Telephone Encounter (Signed)
I spoke with pharmacy & they did not receive it so we resent it. Pt notified

## 2023-06-10 NOTE — Addendum Note (Signed)
Addended by: Warden Fillers on: 06/10/2023 09:34 AM   Modules accepted: Orders

## 2023-06-10 NOTE — Telephone Encounter (Signed)
Copied from CRM [id number]. Topic: Clinical - Medication Question >> Jun 10, 2023  8:53 AM Leavy Cella D wrote: Reason for CRM: Patient stated that pharmacy has requested a refill on 1/27 foramLODipine (NORVASC) 2.5 MG tablet . Patient stated that pharmacy does not have anything back from provider . Per patients chart patient has 1 refill as of 1/27. Please contact patient regarding medication

## 2023-07-29 ENCOUNTER — Telehealth: Payer: Self-pay

## 2023-07-29 ENCOUNTER — Other Ambulatory Visit: Payer: Self-pay

## 2023-07-29 MED ORDER — AMLODIPINE BESYLATE 2.5 MG PO TABS
2.5000 mg | ORAL_TABLET | Freq: Every day | ORAL | 1 refills | Status: DC
Start: 2023-07-29 — End: 2023-12-13

## 2023-07-29 MED ORDER — TELMISARTAN 80 MG PO TABS: 80.0000 mg | ORAL_TABLET | Freq: Every day | ORAL | 1 refills | Status: DC

## 2023-07-29 NOTE — Telephone Encounter (Signed)
 Copied from CRM [id number]. Topic: General - Other >> Jul 29, 2023  1:51 PM Fredrich Romans wrote: Reason for CRM: Patient returned call back to [facility] and stated that she would like to have the 2 medications sent to [facility] mail delivery due to being able to receive a 3 months supply.

## 2023-07-29 NOTE — Addendum Note (Signed)
 Addended by: Sandy Salaam on: 07/29/2023 03:15 PM   Modules accepted: Orders

## 2023-07-29 NOTE — Telephone Encounter (Signed)
 See previous message. Medications have been sent.

## 2023-07-29 NOTE — Telephone Encounter (Signed)
 Medications have been sent to [facility]

## 2023-07-29 NOTE — Telephone Encounter (Signed)
 LMTCB. Need to find out if pt is using [facility] mail order for her maintenance medications because we have received a refill request from them for her Amlodipine and Telmisartan.

## 2023-07-29 NOTE — Telephone Encounter (Signed)
 Copied from CRM [id number]. Topic: Clinical - Medication Question >> Jul 29, 2023 11:04 AM Fredrich Romans wrote: Reason for CRM: Patient would like to know if she could have medications amLODipine (NORVASC) 2.5 MG tablet  and telmisartan (MICARDIS) 80 MG tablet sent to  [facility]) [place], [place] - [address] [place]  Phone: [phone] Fax: [phone]    So that she's able to get a 3 month supply.

## 2023-10-04 ENCOUNTER — Ambulatory Visit (INDEPENDENT_AMBULATORY_CARE_PROVIDER_SITE_OTHER): Payer: 59 | Admitting: Internal Medicine

## 2023-10-04 ENCOUNTER — Encounter: Payer: Self-pay | Admitting: Internal Medicine

## 2023-10-04 VITALS — BP 112/78 | HR 75 | Ht 66.0 in | Wt 192.0 lb

## 2023-10-04 DIAGNOSIS — F411 Generalized anxiety disorder: Secondary | ICD-10-CM

## 2023-10-04 DIAGNOSIS — Z0000 Encounter for general adult medical examination without abnormal findings: Secondary | ICD-10-CM | POA: Diagnosis not present

## 2023-10-04 DIAGNOSIS — E785 Hyperlipidemia, unspecified: Secondary | ICD-10-CM

## 2023-10-04 DIAGNOSIS — I10 Essential (primary) hypertension: Secondary | ICD-10-CM

## 2023-10-04 DIAGNOSIS — Z6830 Body mass index (BMI) 30.0-30.9, adult: Secondary | ICD-10-CM

## 2023-10-04 DIAGNOSIS — R7303 Prediabetes: Secondary | ICD-10-CM | POA: Diagnosis not present

## 2023-10-04 DIAGNOSIS — Z79899 Other long term (current) drug therapy: Secondary | ICD-10-CM | POA: Diagnosis not present

## 2023-10-04 DIAGNOSIS — Z1231 Encounter for screening mammogram for malignant neoplasm of breast: Secondary | ICD-10-CM

## 2023-10-04 DIAGNOSIS — E669 Obesity, unspecified: Secondary | ICD-10-CM

## 2023-10-04 DIAGNOSIS — F5101 Primary insomnia: Secondary | ICD-10-CM

## 2023-10-04 DIAGNOSIS — R5383 Other fatigue: Secondary | ICD-10-CM

## 2023-10-04 LAB — COMPREHENSIVE METABOLIC PANEL WITH GFR
ALT: 20 U/L (ref 0–35)
AST: 21 U/L (ref 0–37)
Albumin: 4.6 g/dL (ref 3.5–5.2)
Alkaline Phosphatase: 44 U/L (ref 39–117)
BUN: 14 mg/dL (ref 6–23)
CO2: 27 meq/L (ref 19–32)
Calcium: 9.6 mg/dL (ref 8.4–10.5)
Chloride: 96 meq/L (ref 96–112)
Creatinine, Ser: 0.55 mg/dL (ref 0.40–1.20)
GFR: 100.13 mL/min (ref >60.00)
Glucose, Bld: 96 mg/dL (ref 70–99)
Potassium: 4.4 meq/L (ref 3.5–5.1)
Sodium: 131 meq/L — ABNORMAL LOW (ref 135–145)
Total Bilirubin: 0.6 mg/dL (ref 0.2–1.2)
Total Protein: 7.5 g/dL (ref 6.0–8.3)

## 2023-10-04 LAB — TSH: TSH: 0.97 u[IU]/mL (ref 0.35–5.50)

## 2023-10-04 LAB — HEMOGLOBIN A1C: Hgb A1c MFr Bld: 5.8 % (ref 4.6–6.5)

## 2023-10-04 LAB — CBC WITH DIFFERENTIAL/PLATELET
Basophils Absolute: 0 10*3/uL (ref 0.0–0.1)
Basophils Relative: 0.9 % (ref 0.0–3.0)
Eosinophils Absolute: 0.1 10*3/uL (ref 0.0–0.7)
Eosinophils Relative: 2 % (ref 0.0–5.0)
HCT: 38.3 % (ref 36.0–46.0)
Hemoglobin: 12.9 g/dL (ref 12.0–15.0)
Lymphocytes Relative: 14.4 % (ref 12.0–46.0)
Lymphs Abs: 0.7 10*3/uL (ref 0.7–4.0)
MCHC: 33.7 g/dL (ref 30.0–36.0)
MCV: 92.2 fl (ref 78.0–100.0)
Monocytes Absolute: 0.6 10*3/uL (ref 0.1–1.0)
Monocytes Relative: 12.1 % — ABNORMAL HIGH (ref 3.0–12.0)
Neutro Abs: 3.3 10*3/uL (ref 1.4–7.7)
Neutrophils Relative %: 70.6 % (ref 43.0–77.0)
Platelets: 301 10*3/uL (ref 150.0–400.0)
RBC: 4.16 Mil/uL (ref 3.87–5.11)
RDW: 12.6 % (ref 11.5–15.5)
WBC: 4.7 10*3/uL (ref 4.0–10.5)

## 2023-10-04 LAB — LIPID PANEL
Cholesterol: 196 mg/dL (ref 0–200)
HDL: 66 mg/dL (ref >39.00)
LDL Cholesterol: 113 mg/dL — ABNORMAL HIGH (ref 0–99)
NonHDL: 129.9
Total CHOL/HDL Ratio: 3
Triglycerides: 83 mg/dL (ref 0.0–149.0)
VLDL: 16.6 mg/dL (ref 0.0–40.0)

## 2023-10-04 LAB — LDL CHOLESTEROL, DIRECT: Direct LDL: 112 mg/dL

## 2023-10-04 LAB — MICROALBUMIN / CREATININE URINE RATIO
Creatinine,U: 64.8 mg/dL
Microalb Creat Ratio: 11.8 mg/g (ref 0.0–30.0)
Microalb, Ur: 0.8 mg/dL (ref 0.0–1.9)

## 2023-10-04 MED ORDER — OXYBUTYNIN CHLORIDE ER 5 MG PO TB24: 5.0000 mg | ORAL_TABLET | Freq: Every day | ORAL | 1 refills | Status: DC | PRN

## 2023-10-04 MED ORDER — ALPRAZOLAM 0.5 MG PO TABS: 0.2500 mg | ORAL_TABLET | Freq: Two times a day (BID) | ORAL | 5 refills | Status: AC | PRN

## 2023-10-04 NOTE — Assessment & Plan Note (Signed)
 She is losign weight gradually by following a low glycemic index diet utilizing smaller more frequent meals  and increased activity .  Lab Results  Component Value Date   TSH 0.96 09/01/2022   Lab Results  Component Value Date   HGBA1C 5.9 04/06/2023   Lab Results  Component Value Date   CHOL 213 (H) 04/06/2023   HDL 57.60 04/06/2023   LDLCALC 136 (H) 04/06/2023   LDLDIRECT 118.0 04/06/2023   TRIG 93.0 04/06/2023   CHOLHDL 4 04/06/2023

## 2023-10-04 NOTE — Patient Instructions (Addendum)
 YOUR MAMMOGRAM Ihas been ordered , PLEASE CALL AND GET THIS SCHEDULED! [facility] - call [phone]  Tony Frederickson does  the scheduling for [facility] as well      You might want to try using Relaxium for insomnia  (as seen on TV commercials) . It is available through [employer] and contains all natural supplements:  Melatonin 5 mg  Chamomile 25 mg Passionflower extract 75 mg GABA 100 mg Ashwaganda extract 125 mg Magnesium citrate, glycinate, oxide (100 mg)  L tryptophan 500 mg Valerest (proprietary  ingredient ; probably valeria root extract)     Your blood pressure medications were not changed today.    Please continue to monitor your blood pressure  ONCE A WEEK at home and let me know if either number is 130/80 or higher on a consistent basis so I can determine if you need to start an additional  medication to lower your blood pressure .

## 2023-10-04 NOTE — Assessment & Plan Note (Signed)
 age appropriate education and counseling updated, referrals for preventative services and immunizations addressed, dietary and smoking counseling addressed, most recent labs reviewed.  I have personally reviewed and have noted:   1) the patient's medical and social history 2) The pt's use of alcohol, tobacco, and illicit drugs 3) The patient's current medications and supplements 4) Functional ability including ADL's, fall risk, home safety risk, hearing and visual impairment 5) Diet and physical activities 6) Evidence for depression or mood disorder 7) The patient's height, weight, and BMI have been recorded in the chart\  She continues to defer Pneumonia vaccines Prefers to avoid annual mammogram but willing to have one this year Colon ca screening due 2026 (10 yrs post last colonoscopy)   I have made referrals, and provided counseling and education based on review of the above

## 2023-10-04 NOTE — Progress Notes (Signed)
 Patient ID: Natalie Pugh, female    DOB: 07/04/1965  Age: 58 y.o. MRN: [id number]  The patient is here for annual preventive examination and management of other chronic and acute problems.   The risk factors are reflected in the social history.   The roster of all physicians providing medical care to patient - is listed in the Snapshot section of the chart.   Activities of daily living:  The patient is 100% independent in all ADLs: dressing, toileting, feeding as well as independent mobility   Home safety : The patient has smoke detectors in the home. They wear seatbelts.  There are no unsecured firearms at home. There is no violence in the home.    There is no risks for hepatitis, STDs or HIV. There is no   history of blood transfusion. They have no travel history to infectious disease endemic areas of the world.   The patient has seen their dentist in the last six month. They have seen their eye doctor in the last year. The patinet  denies slight hearing difficulty with regard to whispered voices and some television programs.  They have deferred audiologic testing in the last year.  They do not  have excessive sun exposure. Discussed the need for sun protection: hats, long sleeves and use of sunscreen if there is significant sun exposure.    Diet: the importance of a healthy diet is discussed. They do have a healthy diet.   The benefits of regular aerobic exercise were discussed. The patient  exercises  USING A VARETY OF EXERCISES INCLUDING  WALKING AND USING A ROWING MACHINE  4 days per week  for  30 minutes.    Depression screen: there are no signs or vegative symptoms of depression- irritability, change in appetite, anhedonia, sadness/tearfullness.   The following portions of the patient's history were reviewed and updated as appropriate: allergies, current medications, past family history, past medical history,  past surgical history, past social history  and problem list.   Visual  acuity was not assessed per patient preference since the patient has regular follow up with an  ophthalmologist. Hearing and body mass index were assessed and reviewed.    During the course of the visit the patient was educated and counseled about appropriate screening and preventive services including : fall prevention , diabetes screening, nutrition counseling, colorectal cancer screening, and recommended immunizations.    Chief Complaint:  2 minor falls  attributed to loss of footing,  left foot drop  when tired (MS) .  Doing PT exercises   Anxiety/insomnia:  using 1/2 alprazolam  to help her fall asleep and then infrequently for panic attacks.Refill history confirmed via [place] Controlled Substance databas, accessed by me today.Aaron Aas  Has not tried relaxium  .  Remotely tolerated lexapro .  Not ready for daily med    MS:  saw Mason Sole in Sept.  and new med delayed until January . Taking dimethyl fumarate .  No significant change   tolerating meds.  Nocturia 2-3 times per night    Sees [place] annually in Feb.  No cataracts       Review of Symptoms  Patient denies headache, fevers, malaise, unintentional weight loss, skin rash, eye pain, sinus congestion and sinus pain, sore throat, dysphagia,  hemoptysis , cough, dyspnea, wheezing, chest pain, palpitations, orthopnea, edema, abdominal pain, nausea, melena, diarrhea, constipation, flank pain, dysuria, hematuria, urinary  Frequency, nocturia, numbness, tingling, seizures,  Focal weakness, Loss of consciousness,  Tremor, insomnia, depression, anxiety, and  suicidal ideation.    Physical Exam:  BP 112/78   Pulse 75   Ht 5\' 6"  (1.676 m)   Wt 192 lb (87.1 kg)   LMP 09/28/2014 (Exact Date)   SpO2 98%   BMI 30.99 kg/m    Physical Exam Vitals reviewed.  Constitutional:      General: She is not in acute distress.    Appearance: Normal appearance. She is well-developed. She is obese. She is not ill-appearing, toxic-appearing or diaphoretic.  HENT:      Head: Normocephalic.     Right Ear: Tympanic membrane, ear canal and external ear normal. There is no impacted cerumen.     Left Ear: Tympanic membrane, ear canal and external ear normal. There is no impacted cerumen.     Nose: Nose normal.     Mouth/Throat:     Mouth: Mucous membranes are moist.     Pharynx: Oropharynx is clear.  Eyes:     General: No scleral icterus.       Right eye: No discharge.        Left eye: No discharge.     Conjunctiva/sclera: Conjunctivae normal.     Pupils: Pupils are equal, round, and reactive to light.  Neck:     Thyroid : No thyromegaly.     Vascular: No carotid bruit or JVD.  Cardiovascular:     Rate and Rhythm: Normal rate and regular rhythm.     Heart sounds: Normal heart sounds.  Pulmonary:     Effort: Pulmonary effort is normal. No respiratory distress.     Breath sounds: Normal breath sounds.  Chest:  Breasts:    Breasts are symmetrical.     Right: Normal. No swelling, inverted nipple, mass, nipple discharge, skin change or tenderness.     Left: Normal. No swelling, inverted nipple, mass, nipple discharge, skin change or tenderness.  Abdominal:     General: Bowel sounds are normal.     Palpations: Abdomen is soft. There is no mass.     Tenderness: There is no abdominal tenderness. There is no guarding or rebound.  Musculoskeletal:        General: Normal range of motion.     Cervical back: Normal range of motion and neck supple.  Lymphadenopathy:     Cervical: No cervical adenopathy.     Upper Body:     Right upper body: No supraclavicular, axillary or pectoral adenopathy.     Left upper body: No supraclavicular, axillary or pectoral adenopathy.  Skin:    General: Skin is warm and dry.  Neurological:     General: No focal deficit present.     Mental Status: She is alert and oriented to person, place, and time. Mental status is at baseline.  Psychiatric:        Mood and Affect: Mood normal.        Behavior: Behavior normal.         Thought Content: Thought content normal.        Judgment: Judgment normal.     Assessment and Plan: Prediabetes -     Hemoglobin A1c  Hyperlipidemia, unspecified hyperlipidemia type -     TSH -     Lipid panel -     LDL cholesterol, direct  Essential hypertension -     Comprehensive metabolic panel with GFR -     Microalbumin / creatinine urine ratio  Fatigue, unspecified type  Encounter for screening mammogram for malignant neoplasm of breast -  3D Screening Mammogram, Left and Right; Future  Encounter for preventive health examination Assessment & Plan: age appropriate education and counseling updated, referrals for preventative services and immunizations addressed, dietary and smoking counseling addressed, most recent labs reviewed.  I have personally reviewed and have noted:   1) the patient's medical and social history 2) The pt's use of alcohol, tobacco, and illicit drugs 3) The patient's current medications and supplements 4) Functional ability including ADL's, fall risk, home safety risk, hearing and visual impairment 5) Diet and physical activities 6) Evidence for depression or mood disorder 7) The patient's height, weight, and BMI have been recorded in the chart\  She continues to defer Pneumonia vaccines Prefers to avoid annual mammogram but willing to have one this year Colon ca screening due 2026 (10 yrs post last colonoscopy)   I have made referrals, and provided counseling and education based on review of the above    Elevated blood pressure reading in office with white coat syndrome, with diagnosis of hypertension Assessment & Plan: Well controlled on  amlodipine  2.5  mg daily added to to current regiemn of micardis  80 mg and metoprolol  50 mg . DID NOT TOLERATE hydrochlorothiazide     Long-term current use of high risk medication other than anticoagulant -     CBC with Differential/Platelet  Primary insomnia -     ALPRAZolam ; Take 0.5 tablets (0.25  mg total) by mouth 2 (two) times daily as needed for anxiety or sleep.  Dispense: 30 tablet; Refill: 5  Generalized anxiety disorder Assessment & Plan: Symptoms are slightly worse lately  but she declines restart of lexapro  .  She is using < 0.5 mg of alprazolam  daily,  with 0.25 mg at bedtime and and infrequent  use of 0.25 mg alprazolam ( 1/2 tablet) for panic attacks .  Refill history was reviewed via [place] CS database.  The risks and benefits of benzodiazepine use were reviewed  with patient today including excessive sedation leading to respiratory depression,  impaired thinking/driving, and addiction.  Patient was advised to avoid concurrent use with alcohol, to use medication only as needed and not to share with others  .    Obesity (BMI 30-39.9) Assessment & Plan: She is losign weight gradually by following a low glycemic index diet utilizing smaller more frequent meals  and increased activity .  Lab Results  Component Value Date   TSH 0.96 09/01/2022   Lab Results  Component Value Date   HGBA1C 5.9 04/06/2023   Lab Results  Component Value Date   CHOL 213 (H) 04/06/2023   HDL 57.60 04/06/2023   LDLCALC 136 (H) 04/06/2023   LDLDIRECT 118.0 04/06/2023   TRIG 93.0 04/06/2023   CHOLHDL 4 04/06/2023        Other orders -     oxyBUTYnin  Chloride ER; Take 1 tablet (5 mg total) by mouth daily as needed.  Dispense: 90 tablet; Refill: 1    Return in about 6 months (around 04/05/2024) for hypertension.  Thersia Flax, MD

## 2023-10-04 NOTE — Assessment & Plan Note (Signed)
 Symptoms are slightly worse lately  but she declines restart of lexapro  .  She is using < 0.5 mg of alprazolam  daily,  with 0.25 mg at bedtime and and infrequent  use of 0.25 mg alprazolam ( 1/2 tablet) for panic attacks .  Refill history was reviewed via [place] CS database.  The risks and benefits of benzodiazepine use were reviewed  with patient today including excessive sedation leading to respiratory depression,  impaired thinking/driving, and addiction.  Patient was advised to avoid concurrent use with alcohol, to use medication only as needed and not to share with others  .

## 2023-10-04 NOTE — Assessment & Plan Note (Signed)
 Well controlled on  amlodipine  2.5  mg daily added to to current regiemn of micardis  80 mg and metoprolol  50 mg . DID NOT TOLERATE hydrochlorothiazide 

## 2023-10-05 ENCOUNTER — Ambulatory Visit: Payer: Self-pay | Admitting: Internal Medicine

## 2023-10-12 ENCOUNTER — Telehealth: Payer: Self-pay

## 2023-10-12 NOTE — Telephone Encounter (Signed)
 Copied from CRM [id number]. Topic: General - Other >> Oct 12, 2023  2:44 PM Luane Rumps D wrote: Reason for CRM: Ms. Natalie Pugh is calling to check the status of her biometric screening paperwork. Previously was waiting on bloodwork to come in, now that blood work is complete she is wondering when she is able to pick it up. She is wondering as well if her son Natalie Pugh could pick up the paperwork for her.

## 2023-10-17 NOTE — Telephone Encounter (Signed)
 Completed form and placed in quick sign folder.

## 2023-10-18 NOTE — Telephone Encounter (Signed)
 Left message letting pt know that form is complete and ready for pick up.

## 2023-10-20 ENCOUNTER — Ambulatory Visit
Admission: RE | Admit: 2023-10-20 | Discharge: 2023-10-20 | Disposition: A | Discharge status: Home / Self Care | Source: Ambulatory Visit | Attending: Internal Medicine | Admitting: Internal Medicine

## 2023-10-20 DIAGNOSIS — Z1231 Encounter for screening mammogram for malignant neoplasm of breast: Secondary | ICD-10-CM | POA: Insufficient documentation

## 2023-12-12 ENCOUNTER — Other Ambulatory Visit: Payer: Self-pay | Admitting: Internal Medicine

## 2024-04-11 ENCOUNTER — Ambulatory Visit: Admitting: Internal Medicine

## 2024-05-25 ENCOUNTER — Encounter: Payer: Self-pay | Admitting: Internal Medicine

## 2024-05-25 ENCOUNTER — Ambulatory Visit: Admitting: Internal Medicine

## 2024-05-25 VITALS — BP 105/78 | HR 77 | Temp 97.9°F | Ht 66.0 in | Wt 180.6 lb

## 2024-05-25 DIAGNOSIS — I10 Essential (primary) hypertension: Secondary | ICD-10-CM

## 2024-05-25 DIAGNOSIS — R351 Nocturia: Secondary | ICD-10-CM | POA: Diagnosis not present

## 2024-05-25 DIAGNOSIS — I872 Venous insufficiency (chronic) (peripheral): Secondary | ICD-10-CM | POA: Diagnosis not present

## 2024-05-25 DIAGNOSIS — R7303 Prediabetes: Secondary | ICD-10-CM | POA: Diagnosis not present

## 2024-05-25 DIAGNOSIS — G35A Relapsing-remitting multiple sclerosis: Secondary | ICD-10-CM

## 2024-05-25 DIAGNOSIS — Z79899 Other long term (current) drug therapy: Secondary | ICD-10-CM

## 2024-05-25 DIAGNOSIS — N319 Neuromuscular dysfunction of bladder, unspecified: Secondary | ICD-10-CM

## 2024-05-25 DIAGNOSIS — E785 Hyperlipidemia, unspecified: Secondary | ICD-10-CM

## 2024-05-25 DIAGNOSIS — Z0000 Encounter for general adult medical examination without abnormal findings: Secondary | ICD-10-CM | POA: Diagnosis not present

## 2024-05-25 MED ORDER — METOPROLOL SUCCINATE ER 50 MG PO TB24: ORAL_TABLET | ORAL | 3 refills | Status: AC

## 2024-05-25 MED ORDER — AMLODIPINE BESYLATE 2.5 MG PO TABS: 2.5000 mg | ORAL_TABLET | Freq: Every day | ORAL | 1 refills | Status: AC

## 2024-05-25 MED ORDER — TELMISARTAN 80 MG PO TABS: 80.0000 mg | ORAL_TABLET | Freq: Every day | ORAL | 1 refills | Status: AC

## 2024-05-25 MED ORDER — OXYBUTYNIN CHLORIDE ER 5 MG PO TB24: 5.0000 mg | ORAL_TABLET | Freq: Every day | ORAL | 1 refills | Status: AC | PRN

## 2024-05-25 NOTE — Patient Instructions (Signed)
 You can try using magnesium citrate (or citrate of magnesium)  for the constipation: it is a liquid cathartic that usually works In 30 to 90 minutes)

## 2024-05-25 NOTE — Progress Notes (Signed)
 "  Subjective:  Patient ID: Natalie Pugh, female    DOB: [birth year]  Age: 59 y.o. MRN: [id number]  CC: The primary encounter diagnosis was Venous insufficiency of both lower extremities. Diagnoses of Hyperlipidemia, unspecified hyperlipidemia type, Prediabetes, Long-term current use of high risk medication other than anticoagulant, Essential hypertension, Elevated blood pressure reading in office with white coat syndrome, with diagnosis of hypertension, Nocturia, Neurogenic bladder, Multiple sclerosis, relapsing-remitting (HCC), and Encounter for preventive health examination were also pertinent to this visit.   HPI Natalie Pugh presents for  Chief Complaint  Patient presents with   Hypertension   Hyperlipidemia   Prediabetes      Hypertension: patient checks blood pressure twice weekly at home.  Readings have been for the most part <130/80 at rest . Patient is following a reduced salt diet most days and is taking medications as prescribed    Overweight:  has lowered her BMI since last visit ,  exercising  as tolerated by MS.  Using low weights,  walking with walker.  Intermittent fasting .  Limited by left hand weakness over the past 1. 5 yrs due to MS>   left forearm muscle is smaller than thr right  Self catheterizes   Fingers get cold ,  do not change color.    Chronic constipation   complciated by MS.   Stools every other day,  getting harder to push out.  Tried eneams, suppositories      Outpatient Medications Prior to Visit  Medication Sig Dispense Refill   ALPRAZolam  (XANAX ) 0.5 MG tablet Take 0.5 tablets (0.25 mg total) by mouth 2 (two) times daily as needed for anxiety or sleep. 30 tablet 5   Cholecalciferol (VITAMIN D3) 1000 UNITS CAPS Take 2 capsules by mouth daily.      Cranberry 250 MG CAPS Take 2 capsules by mouth daily.     Dimethyl Fumarate 240 MG CPDR      MAGNESIUM CITRATE PO Take 250 mg by mouth daily.     Multiple Vitamin (MULTIVITAMIN) tablet Take 1  tablet by mouth daily.     amLODipine  (NORVASC ) 2.5 MG tablet TAKE 1 TABLET BY MOUTH DAILY 90 tablet 1   metoprolol  succinate (TOPROL -XL) 50 MG 24 hr tablet TAKE 1 TABLET BY MOUTH DAILY  WITH OR IMMEDIATELY FOLLOWING A  MEAL 90 tablet 3   oxybutynin  (DITROPAN -XL) 5 MG 24 hr tablet Take 1 tablet (5 mg total) by mouth daily as needed. 90 tablet 1   telmisartan  (MICARDIS ) 80 MG tablet TAKE 1 TABLET BY MOUTH DAILY 90 tablet 1   No facility-administered medications prior to visit.    Review of Systems;  Patient denies headache, fevers, malaise, unintentional weight loss, skin rash, eye pain, sinus congestion and sinus pain, sore throat, dysphagia,  hemoptysis , cough, dyspnea, wheezing, chest pain, palpitations, orthopnea, edema, abdominal pain, nausea, melena, diarrhea, constipation, flank pain, dysuria, hematuria, urinary  Frequency, nocturia, numbness, tingling, seizures,  Focal weakness, Loss of consciousness,  Tremor, insomnia, depression, anxiety, and suicidal ideation.      Objective:  BP 105/78 (Cuff Size: Normal)   Pulse 77   Temp 97.9 F (36.6 C)   Ht 5' 6 (1.676 m)   Wt 180 lb 9.6 oz (81.9 kg)   LMP 09/28/2014   SpO2 96%   BMI 29.15 kg/m   BP Readings from Last 3 Encounters:  05/25/24 105/78  10/04/23 112/78  05/09/23 (!) 138/92    Wt Readings from Last 3 Encounters:  05/25/24  180 lb 9.6 oz (81.9 kg)  10/04/23 192 lb (87.1 kg)  04/06/23 197 lb 8 oz (89.6 kg)    Physical Exam Vitals reviewed.  Constitutional:      General: She is not in acute distress.    Appearance: Normal appearance. She is normal weight. She is not ill-appearing, toxic-appearing or diaphoretic.  HENT:     Head: Normocephalic.  Eyes:     General: No scleral icterus.       Right eye: No discharge.        Left eye: No discharge.     Conjunctiva/sclera: Conjunctivae normal.  Cardiovascular:     Rate and Rhythm: Normal rate and regular rhythm.     Heart sounds: Normal heart sounds.   Pulmonary:     Effort: Pulmonary effort is normal. No respiratory distress.     Breath sounds: Normal breath sounds.  Musculoskeletal:        General: Normal range of motion.  Skin:    General: Skin is warm and dry.  Neurological:     General: No focal deficit present.     Mental Status: She is alert and oriented to person, place, and time. Mental status is at baseline.  Psychiatric:        Mood and Affect: Mood normal.        Behavior: Behavior normal.        Thought Content: Thought content normal.        Judgment: Judgment normal.     Lab Results  Component Value Date   HGBA1C 5.5 05/25/2024   HGBA1C 5.8 10/04/2023   HGBA1C 5.9 04/06/2023    Lab Results  Component Value Date   CREATININE 0.60 05/25/2024   CREATININE 0.55 10/04/2023   CREATININE 0.72 05/09/2023    Lab Results  Component Value Date   WBC 6.7 05/25/2024   HGB 13.1 05/25/2024   HCT 39.5 05/25/2024   PLT 296 05/25/2024   GLUCOSE 84 05/25/2024   CHOL 216 (H) 05/25/2024   TRIG 81 05/25/2024   HDL 74 05/25/2024   LDLDIRECT 129 (H) 05/25/2024   LDLCALC 128 (H) 05/25/2024   ALT 19 05/25/2024   AST 22 05/25/2024   NA 136 05/25/2024   K 4.5 05/25/2024   CL 98 05/25/2024   CREATININE 0.60 05/25/2024   BUN 15 05/25/2024   CO2 21 05/25/2024   TSH 0.978 05/25/2024   HGBA1C 5.5 05/25/2024   MICROALBUR 0.8 10/04/2023    MM 3D SCREENING MAMMOGRAM BILATERAL BREAST Result Date: 10/24/2023 CLINICAL DATA:  Screening. EXAM: DIGITAL SCREENING BILATERAL MAMMOGRAM WITH TOMOSYNTHESIS AND CAD TECHNIQUE: Bilateral screening digital craniocaudal and mediolateral oblique mammograms were obtained. Bilateral screening digital breast tomosynthesis was performed. The images were evaluated with computer-aided detection. COMPARISON:  Previous exam(s). ACR Breast Density Category b: There are scattered areas of fibroglandular density. FINDINGS: There are no findings suspicious for malignancy. IMPRESSION: No mammographic  evidence of malignancy. A result letter of this screening mammogram will be mailed directly to the patient. RECOMMENDATION: Screening mammogram in one year. (Code:SM-B-01Y) BI-RADS CATEGORY  1: Negative. Electronically Signed   By: Toribio Agreste M.D.   On: 10/24/2023 08:57    Assessment & Plan:  .Venous insufficiency of both lower extremities Assessment & Plan: Secondary to disuse from MS.  Encouarged to elevate feet and exercise calves more frequently    Hyperlipidemia, unspecified hyperlipidemia type -     LDL cholesterol, direct -     Lipid panel -     TSH  Prediabetes Assessment & Plan: A1c   is stable.  Weight gain addressed.  I reviewed  her diet and exercise regimen, providing  counselling about the the role of diet and exercise  In preventing progression from prediabetes to diabetes,and making suggestions on how to modify her current lifestyle.     Lab Results  Component Value Date   HGBA1C 5.5 05/25/2024     Orders: -     Hemoglobin A1c  Long-term current use of high risk medication other than anticoagulant -     CBC with Differential/Platelet  Essential hypertension -     Comprehensive metabolic panel with GFR  Elevated blood pressure reading in office with white coat syndrome, with diagnosis of hypertension Assessment & Plan: Home readings reviewed   rang ein 105/89 to 125/86  on current regimen , no changes today     Nocturia -     Urinalysis, Routine w reflex microscopic -     Urine Culture  Neurogenic bladder Assessment & Plan: Requiring self catheterization 5 to 8 times daily .  She has noted cloudy urine and is requesting evaluation for UTI.  UA is abnormal,  awaiting culture    Multiple sclerosis, relapsing-remitting (HCC) Assessment & Plan: Currently untreated by choice.  Feels better without  Rebiff.  Has not had Follow up with Dr Maree in over a year.  However she continues regular checkups with ophthalomology.   Last MRI 2018 and she deferred  repeat imaging  due to cost  and lack of progression of symptoms .  She is walking daily in the house,  using Fit Bit to guide her., and doing the under desk bike.  Cleaning her own house and independent.    Her balance is poor so she is now using a cane to prevent falls.     Encounter for preventive health examination Assessment & Plan: age appropriate education and counseling updated, referrals for preventative services and immunizations addressed, dietary and smoking counseling addressed, most recent labs reviewed.  I have personally reviewed and have noted:   1) the patient's medical and social history 2) The pt's use of alcohol, tobacco, and illicit drugs 3) The patient's current medications and supplements 4) Functional ability including ADL's, fall risk, home safety risk, hearing and visual impairment 5) Diet and physical activities 6) Evidence for depression or mood disorder 7) The patient's height, weight, and BMI have been recorded in the chart   I have made referrals, and provided counseling and education based on review of the above    Other orders -     amLODIPine  Besylate; Take 1 tablet (2.5 mg total) by mouth daily.  Dispense: 90 tablet; Refill: 1 -     Metoprolol  Succinate ER; Take with or immediately following a meal.TAKE 1 TABLET BY MOUTH DAILY  WITH OR IMMEDIATELY FOLLOWING A  MEAL  Dispense: 90 tablet; Refill: 3 -     oxyBUTYnin  Chloride ER; Take 1 tablet (5 mg total) by mouth daily as needed.  Dispense: 90 tablet; Refill: 1 -     Telmisartan ; Take 1 tablet (80 mg total) by mouth daily.  Dispense: 90 tablet; Refill: 1 -     Microscopic Examination     I spent 34 minutes on the day of this face to face encounter reviewing patient's  most recent visit with cardiology,  nephrology,  and neurology,  prior relevant surgical and non surgical procedures, recent  labs and imaging studies, counseling on weight management,  reviewing the assessment  and plan with patient, and  post visit ordering and reviewing of  diagnostics and therapeutics with patient  .   Follow-up: No follow-ups on file.   Verneita LITTIE Kettering, MD "

## 2024-05-25 NOTE — Assessment & Plan Note (Signed)
 Home readings reviewed   rang ein 105/89 to 125/86  on current regimen , no changes today

## 2024-05-26 ENCOUNTER — Ambulatory Visit: Payer: Self-pay | Admitting: Internal Medicine

## 2024-05-26 LAB — LIPID PANEL
Chol/HDL Ratio: 2.9 ratio (ref 0.0–4.4)
Cholesterol, Total: 216 mg/dL — ABNORMAL HIGH (ref 100–199)
HDL: 74 mg/dL
LDL Chol Calc (NIH): 128 mg/dL — ABNORMAL HIGH (ref 0–99)
Triglycerides: 81 mg/dL (ref 0–149)
VLDL Cholesterol Cal: 14 mg/dL (ref 5–40)

## 2024-05-26 LAB — URINALYSIS, ROUTINE W REFLEX MICROSCOPIC
Bilirubin, UA: NEGATIVE
Glucose, UA: NEGATIVE
Ketones, UA: NEGATIVE
Nitrite, UA: POSITIVE — AB
Protein,UA: NEGATIVE
RBC, UA: NEGATIVE
Specific Gravity, UA: 1.014 (ref 1.005–1.030)
Urobilinogen, Ur: 0.2 mg/dL (ref 0.2–1.0)
pH, UA: 7.5 (ref 5.0–7.5)

## 2024-05-26 LAB — CBC WITH DIFFERENTIAL/PLATELET
Basophils Absolute: 0 x10E3/uL (ref 0.0–0.2)
Basos: 1 %
EOS (ABSOLUTE): 0.1 x10E3/uL (ref 0.0–0.4)
Eos: 1 %
Hematocrit: 39.5 % (ref 34.0–46.6)
Hemoglobin: 13.1 g/dL (ref 11.1–15.9)
Immature Grans (Abs): 0 x10E3/uL (ref 0.0–0.1)
Immature Granulocytes: 0 %
Lymphocytes Absolute: 0.7 x10E3/uL (ref 0.7–3.1)
Lymphs: 11 %
MCH: 31.9 pg (ref 26.6–33.0)
MCHC: 33.2 g/dL (ref 31.5–35.7)
MCV: 96 fL (ref 79–97)
Monocytes Absolute: 0.8 x10E3/uL (ref 0.1–0.9)
Monocytes: 12 %
Neutrophils Absolute: 5 x10E3/uL (ref 1.4–7.0)
Neutrophils: 75 %
Platelets: 296 x10E3/uL (ref 150–450)
RBC: 4.11 x10E6/uL (ref 3.77–5.28)
RDW: 12.4 % (ref 11.7–15.4)
WBC: 6.7 x10E3/uL (ref 3.4–10.8)

## 2024-05-26 LAB — COMPREHENSIVE METABOLIC PANEL WITH GFR
ALT: 19 IU/L (ref 0–32)
AST: 22 IU/L (ref 0–40)
Albumin: 4.6 g/dL (ref 3.8–4.9)
Alkaline Phosphatase: 58 IU/L (ref 49–135)
BUN/Creatinine Ratio: 25 (ref 12–28)
BUN: 15 mg/dL (ref 8–27)
Bilirubin Total: 0.4 mg/dL (ref 0.0–1.2)
CO2: 21 mmol/L (ref 20–29)
Calcium: 9.6 mg/dL (ref 8.7–10.3)
Chloride: 98 mmol/L (ref 96–106)
Creatinine, Ser: 0.6 mg/dL (ref 0.57–1.00)
Globulin, Total: 2.2 g/dL (ref 1.5–4.5)
Glucose: 84 mg/dL (ref 70–99)
Potassium: 4.5 mmol/L (ref 3.5–5.2)
Sodium: 136 mmol/L (ref 134–144)
Total Protein: 6.8 g/dL (ref 6.0–8.5)
eGFR: 103 mL/min/1.73

## 2024-05-26 LAB — MICROSCOPIC EXAMINATION
Casts: NONE SEEN /LPF
Epithelial Cells (non renal): NONE SEEN /HPF (ref 0–10)

## 2024-05-26 LAB — HEMOGLOBIN A1C
Est. average glucose Bld gHb Est-mCnc: 111 mg/dL
Hgb A1c MFr Bld: 5.5 % (ref 4.8–5.6)

## 2024-05-26 LAB — TSH: TSH: 0.978 u[IU]/mL (ref 0.450–4.500)

## 2024-05-26 LAB — LDL CHOLESTEROL, DIRECT: LDL Direct: 129 mg/dL — ABNORMAL HIGH (ref 0–99)

## 2024-05-26 NOTE — Assessment & Plan Note (Signed)
Currently untreated by choice.  Feels better without  Rebiff.  Has not had Follow up with Dr Sherryll Burger in over a year.  However she continues regular checkups with ophthalomology.   Last MRI 2018 and she deferred repeat imaging  due to cost  and lack of progression of symptoms .  She is walking daily in the house,  using Fit Bit to guide her., and doing the "under desk" bike.  Cleaning her own house and independent.    Her balance is poor so she is now using a cane to prevent falls.

## 2024-05-26 NOTE — Assessment & Plan Note (Signed)

## 2024-05-26 NOTE — Assessment & Plan Note (Signed)
 A1c   is stable.  Weight gain addressed.  I reviewed  her diet and exercise regimen, providing  counselling about the the role of diet and exercise  In preventing progression from prediabetes to diabetes,and making suggestions on how to modify her current lifestyle.     Lab Results  Component Value Date   HGBA1C 5.5 05/25/2024

## 2024-05-26 NOTE — Assessment & Plan Note (Signed)
 Requiring self catheterization 5 to 8 times daily .  She has noted cloudy urine and is requesting evaluation for UTI.  UA is abnormal,  awaiting culture

## 2024-05-26 NOTE — Assessment & Plan Note (Signed)
Secondary to disuse from MS.  Encouarged to elevate feet and exercise calves more frequently

## 2024-05-28 ENCOUNTER — Other Ambulatory Visit: Payer: Self-pay | Admitting: Internal Medicine

## 2024-05-30 ENCOUNTER — Other Ambulatory Visit: Payer: Self-pay | Admitting: Internal Medicine

## 2024-05-30 LAB — URINE CULTURE

## 2024-05-30 MED ORDER — CIPROFLOXACIN HCL 250 MG PO TABS: 250.0000 mg | ORAL_TABLET | Freq: Two times a day (BID) | ORAL | 0 refills | Status: AC

## 2024-11-23 ENCOUNTER — Ambulatory Visit: Admitting: Internal Medicine
